# Patient Record
Sex: Female | Born: 1950 | Race: Asian | Hispanic: No | Marital: Single
Health system: Western US, Academic
[De-identification: ages and names within clinical notes are randomized; demographics above are authoritative.]

---

## 2018-02-27 ENCOUNTER — Encounter (HOSPITAL_BASED_OUTPATIENT_CLINIC_OR_DEPARTMENT_OTHER): Payer: Self-pay | Admitting: Orthopaedic Surgery

## 2018-02-27 ENCOUNTER — Other Ambulatory Visit: Payer: Self-pay

## 2018-02-27 ENCOUNTER — Ambulatory Visit: Payer: Medicare Other | Attending: Orthopaedic Surgery | Admitting: Orthopaedic Surgery

## 2018-02-27 VITALS — BP 134/81 | HR 74 | Temp 98.1°F | Ht 62.0 in | Wt 180.0 lb

## 2018-02-27 DIAGNOSIS — M539 Dorsopathy, unspecified: Principal | ICD-10-CM | POA: Insufficient documentation

## 2018-02-27 NOTE — Patient Instructions (Addendum)
It was a pleasure seeing you today! You were seen today by Dr. Cherly Hensenhang. Today, we discussed:     Muscle pain  Spine arthritis    1. Follow up with your primary care provider Dr Selena BattenKim for:    Physical therapy consultation and "TENS" unit prescription    Family Medicine Collaborative Care consultation for biofeedback:  call the behavioral health care provider for biofeedback at [phone], who can provide information about the current availability of these services, which can either be paid out of pocket or billed through your health insurance plan (coverage and fees vary by plan).    [facility]cup[facility] at [facility]    [email or web address]      Talk to you PCP Dr Selena BattenKim about pain medications        Take care!  Sherrie Georgeouglas Jurnie Garritano, MD PhD

## 2018-02-27 NOTE — Progress Notes (Signed)
Attending Note:   Dr. Casimer LaniusKim, Terri Chapman requests Orthopaedic Surgical consultation for the patient Terri Chapman for problems related to the   Chief Complaint   Patient presents with   . Back Pain     My report follows:    Subjective:   I reviewed the history and medical record, received and reviewed the H&P intake form, and interviewed and examined the patient.    History of present illness (HPI): 67 year old female here for Orthopaedic Surgical evaluation to treat her pain and functional impairment.    Back pain started 10 years ago, doctor dx'd her with osteoporosis. More frequent pain 2 years ago, started to be daily. Pain in the low back midline to both buttocks sometimes lateral thigh goes to the knee (bilaterally). She says that the back pain is constant but that her leg symptoms last only a few minutes and occur only 3-4 times a week. Actually she reports although she was sent here for back pain, the main pain almost twice as bad as her low back pain is pain about the base of her neck and top of the shoulders. No radiation of pain to the arms/hands. MED ibuprofen 600 mg BID some relief. PT done last year not helpful. Massage done, they advised some equipment to help her, sounds like a TENS machine (not purchased). She wonders about further massage treatments. Pain severity 4-6/10. Difficult to sleep. Gained weight, no f/c/ns, no voiding problems.    I reviewed the past medical history/problem list, allergies, medications, family history and social history from the electronic medical record, and on the intake paperwork:    There is no problem list on file for this patient.    No Known Allergies  No current outpatient medications on file.     No current facility-administered medications for this visit.      No family history on file.No hereditary or high-risk disease identified otherwise.    Social History     Tobacco Use   . Smoking status: Not on file   Substance Use Topics   . Alcohol use: Not on file   . Drug  use: Not on file     Review of Systems: as indicated in the history of present illness and per the intake form. All others reviewed and were negative.    Objective:   Vitals   BP 134/81 (BP Location: Right arm, BP Patient Position: Sitting, BP cuff size: Large)   Pulse 74   Temp 98.1 F (36.7 C) (Oral)   Ht 5\' 2"  (1.575 m)   Wt 81.6 kg (180 lb)   BMI 32.92 kg/m   Gen WNWD, Pulm No SOB, Abd no discomfort, CV 1+ pulses PT  MS cooperative and pleasant  HEENT pupils not pinprick  Neuro normal babinski, symmetric normal DTRs, no clonus on ankle jerk, full strength hf/ke/df/ehl  normal hoffmans, normal DTRs, full strength (grip/WE/EE/EF/ShAbd), normal finger coordinated movements'  NECK moderate forward head posture with rounded shoulders  L SPINE ROME uncomfortable    Studies        Assessment and plan:     Myofascial pain, underlying facet spinal arthritis    This patient does not have a condition amenable to Orthopaedic surgery at this time. Overall lumbar fusion success rate for low back pain ranges from 41-64%, and there is associated morbidity. (Zigler, Delamarter, Vevelyn PatSpivak et al. Spine 2007).     In the meanwhile, here are management suggestions for your office to coordinate:    1.  Follow up with your primary care provider Dr Terri Chapman for:    Physical therapy consultation and "TENS" unit prescription    Family Medicine Collaborative Care consultation for biofeedback:  call the behavioral health care provider for biofeedback at [phone], who can provide information about the current availability of these services, which can either be paid out of pocket or billed through your health insurance plan (coverage and fees vary by plan).    Massage Therapy/Acupuncture at [facility]    [email or web address]      Talk to you PCP Dr Terri Chapman about pain medications    More than half of the 30 minute clinic appointment time was spent face-to-face with the patient to provide  education, counseling and coordination of care. We discussed issues as described above. All of the patient's questions were answered to their satisfaction.    Terri Chapman, M.D., Ph.D.  Clinical Professor, [employer] Department of Orthopaedic Surgery  Chief, Physical Medicine and Rehabilitation   [employer] [employer] medical consultant  [employer] [facility] medical consultant    [email or web address]    CC: Terri Chapman

## 2019-06-18 IMAGING — MG MAMMO SCRN BIL W/CAD TOMO
8 series · 9 of 24 positions shown · non-contrast
Comparison: The present examination has been compared to prior imaging studies.

Images Obtained from [facility]
INDICATION: Screening.
TECHNIQUE: Bilateral 2-D digital screening mammogram was performed followed by 3-D tomosynthesis.  Current study was also evaluated with a computer aided detection (CAD) system.
MAMMOGRAM FINDINGS:
There are scattered areas of fibroglandular density.
No suspicious abnormality is seen in either breast.  There are no significant changes from the prior study.

[L CC]
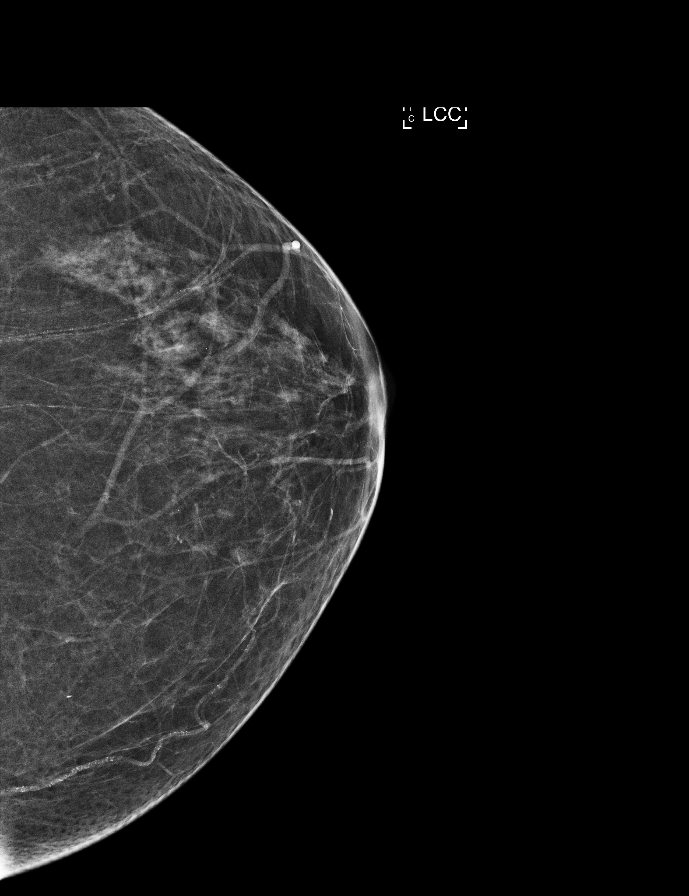

[L MLO]
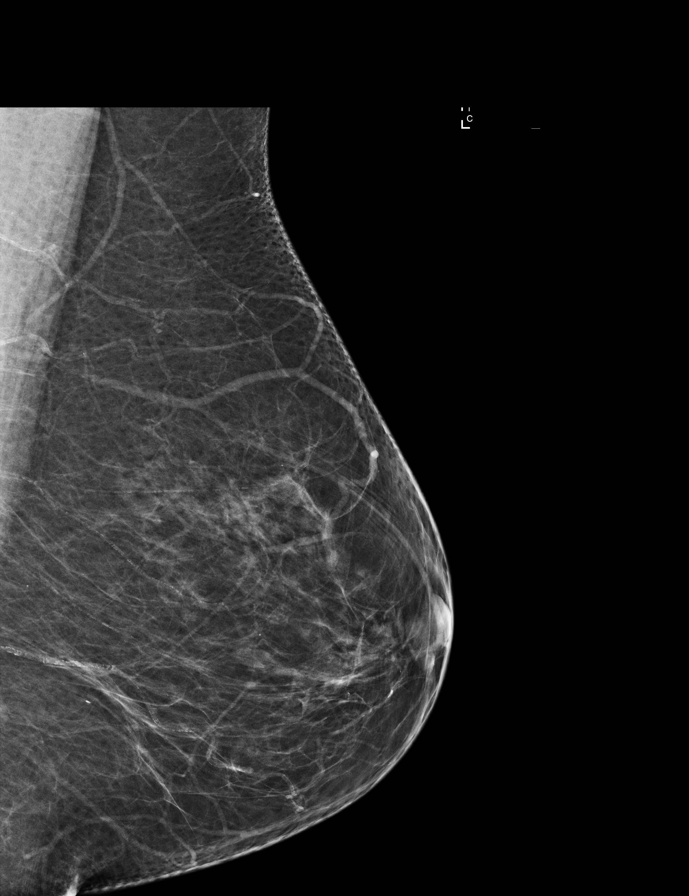

[R MLO]
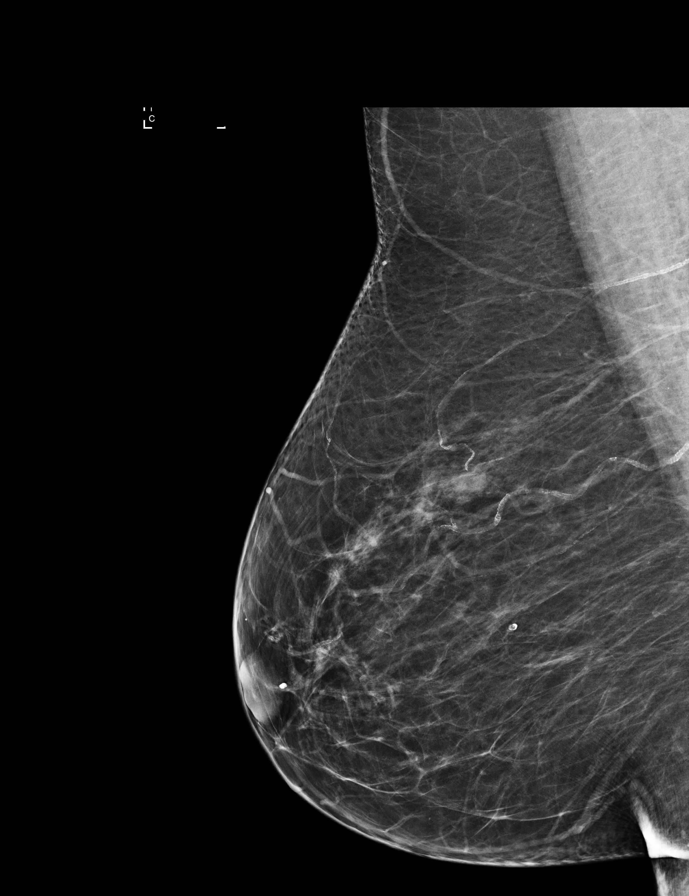

[R CC]
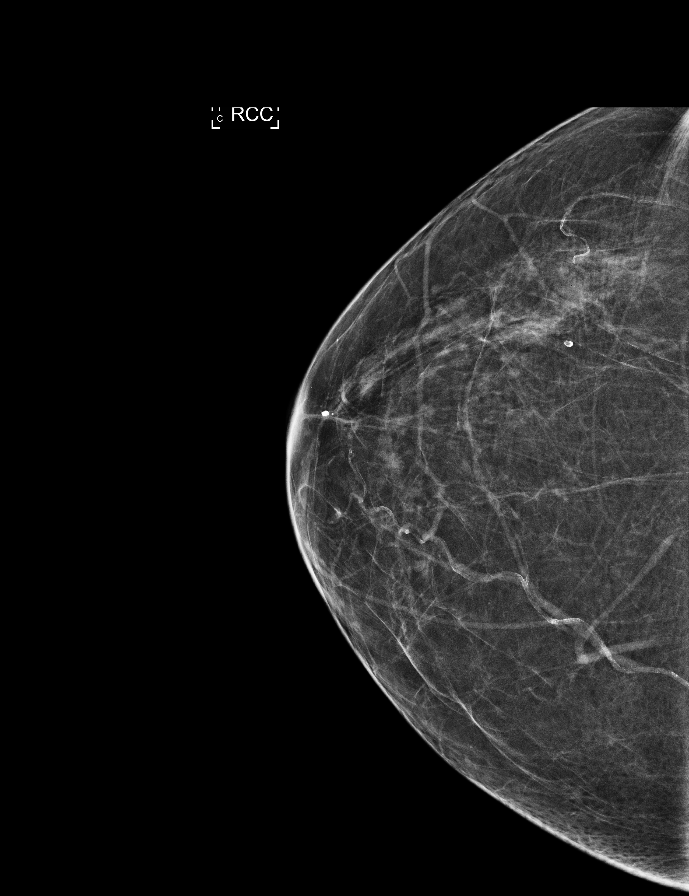

[L CC tomo · 2 of 58 frames shown]
[frame 19/58]
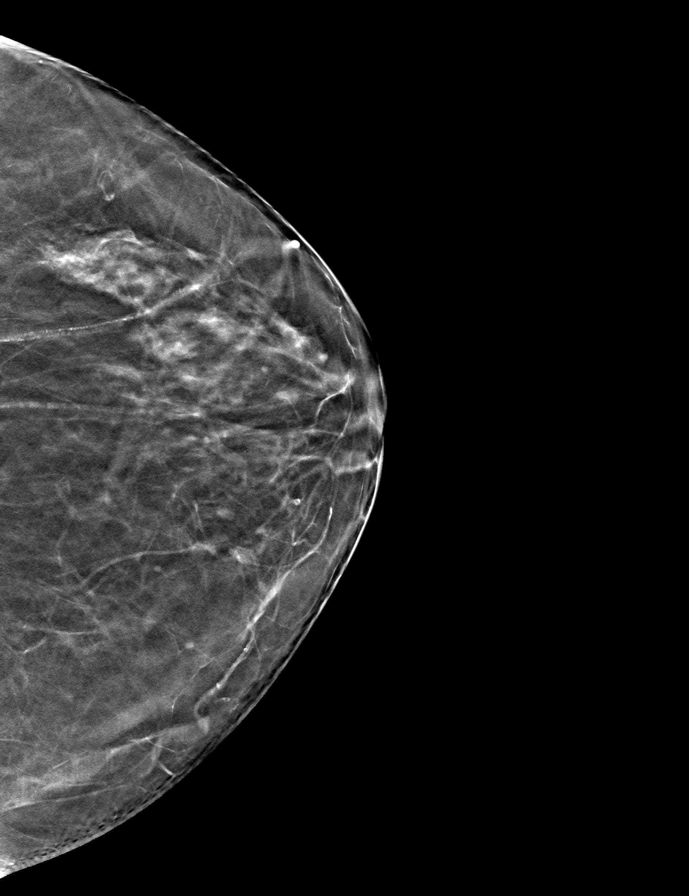
[frame 29/58]
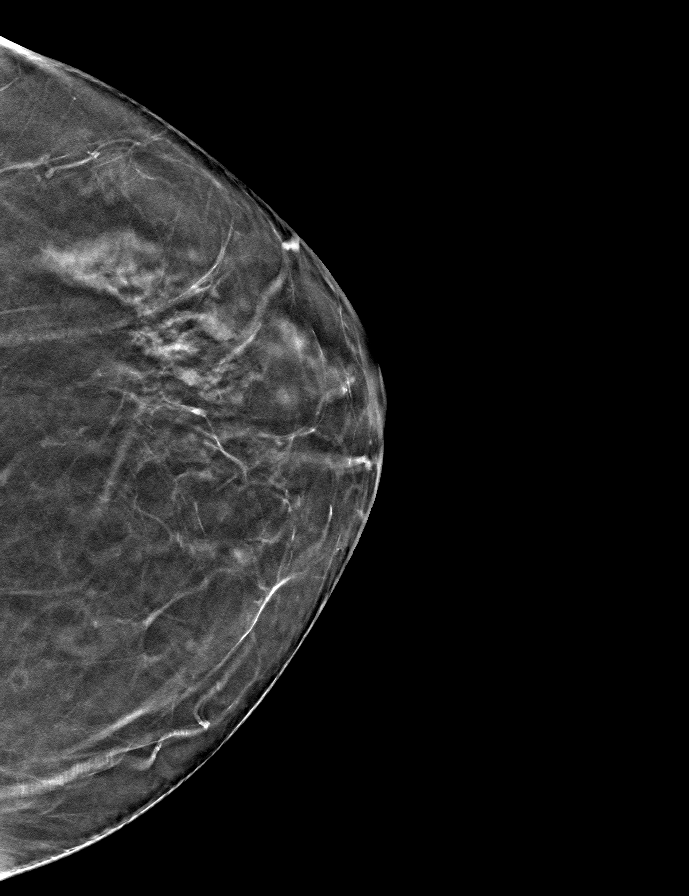

[L MLO tomo · tomo slice 33/66.0]
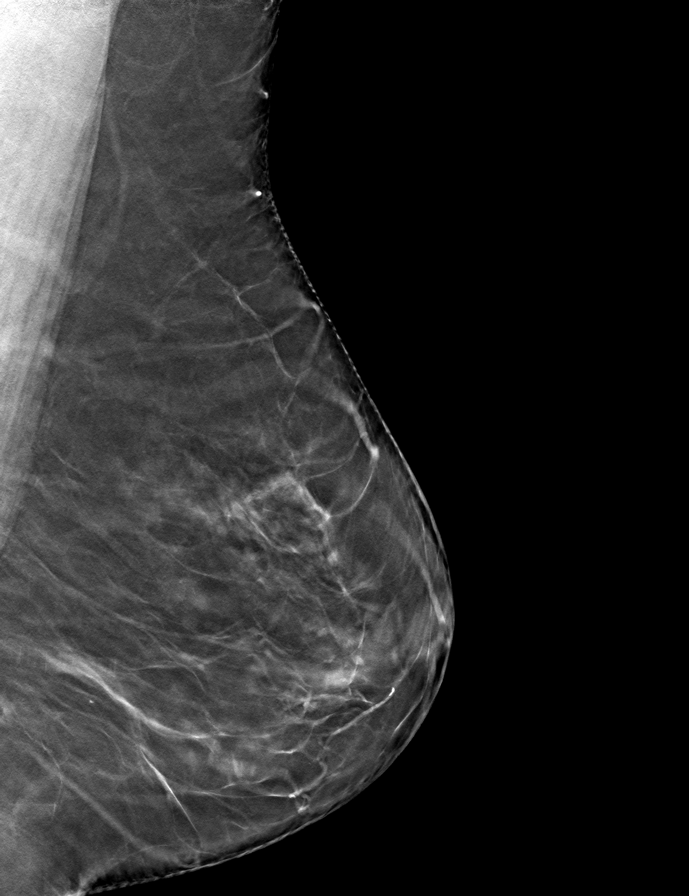

[R MLO tomo · tomo slice 35/69.0]
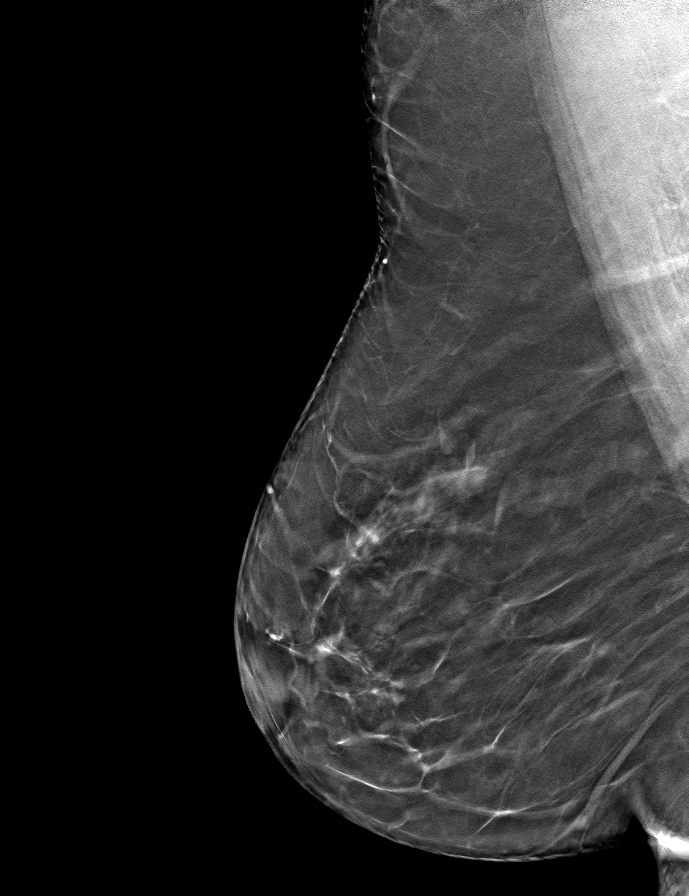

[R CC tomo · tomo slice 30/59.0]
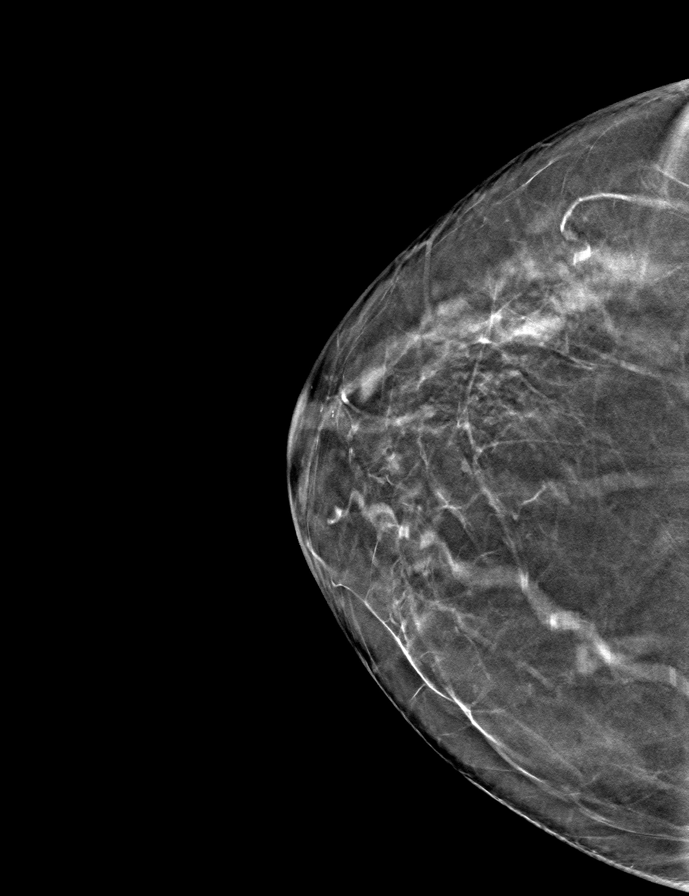

[9 of 24 positions shown; findings below may reference images not displayed]

IMPRESSION: There is no mammographic evidence of malignancy.
Screening mammogram recommended in 1 year.
BI-RADS Category 1: Negative

## 2019-07-19 ENCOUNTER — Encounter (INDEPENDENT_AMBULATORY_CARE_PROVIDER_SITE_OTHER): Payer: Self-pay

## 2020-08-03 IMAGING — CR KNEE RT 3 VWS
1 series · 3 of 3 positions shown · non-contrast
Comparison: None

Right knee radiographs, 3 views
INDICATION: Right knee pain

[Series 1: ap · 0.17mm/px · 3 of 3 slices shown]
[im 1/3]
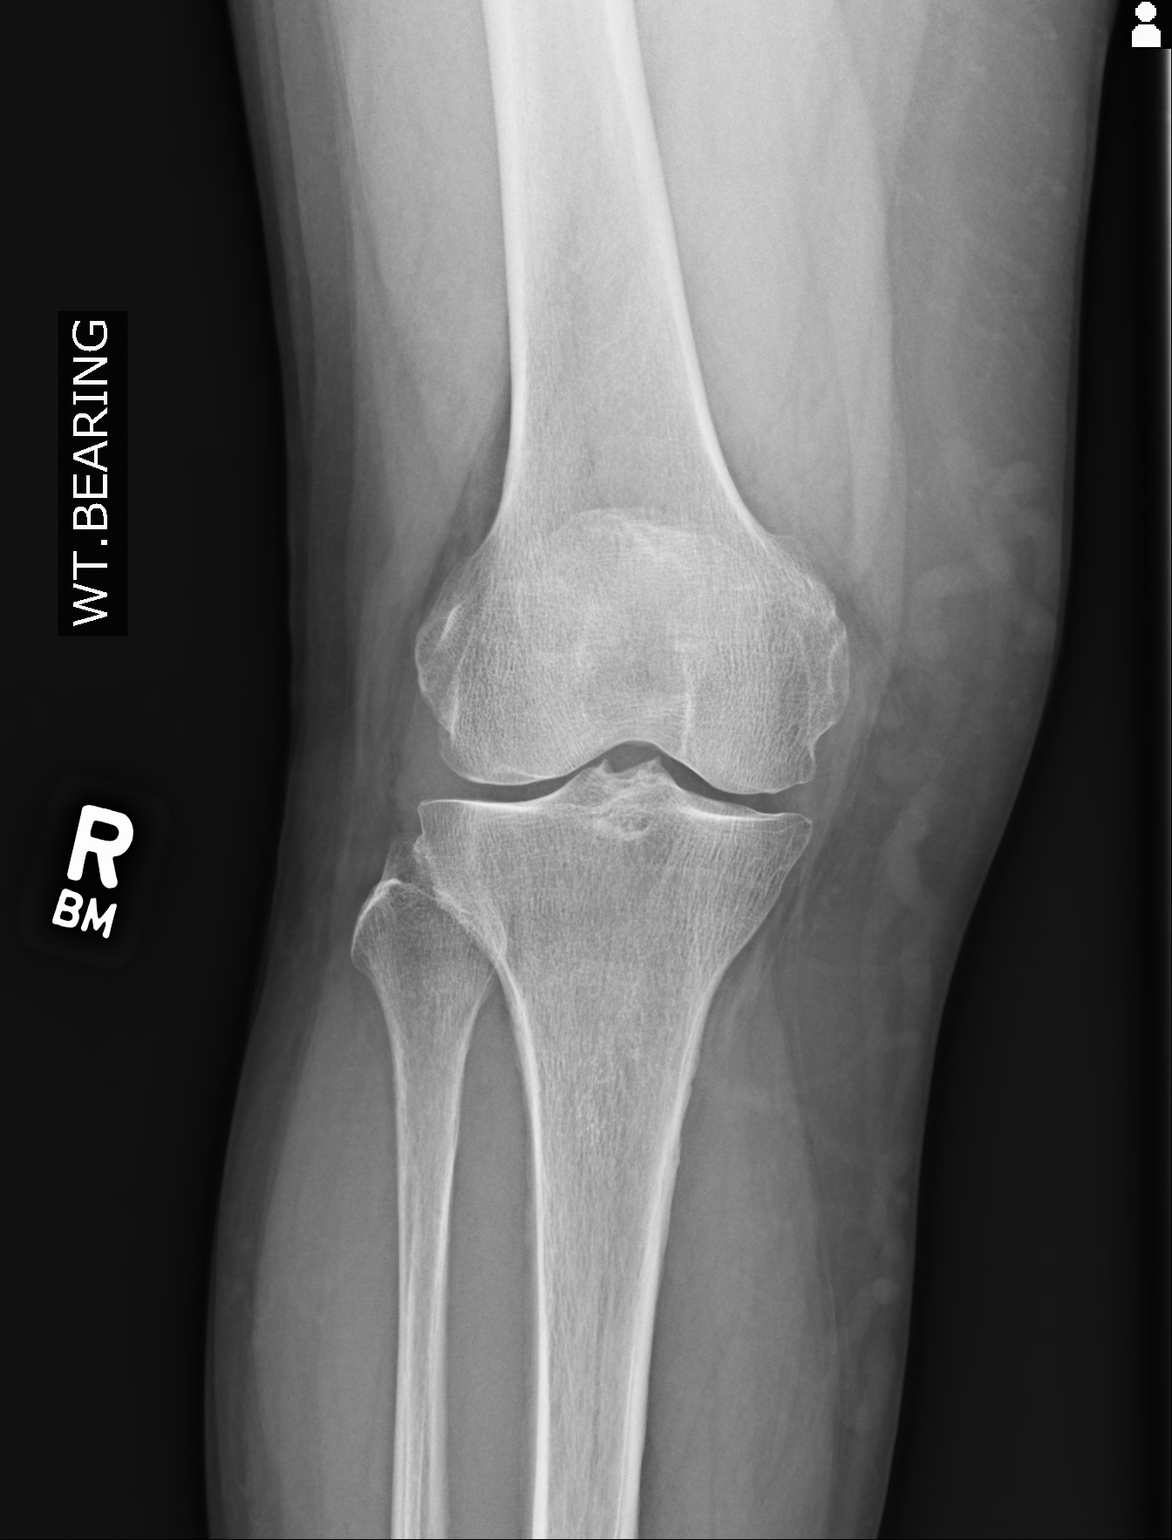
[im 2/3]
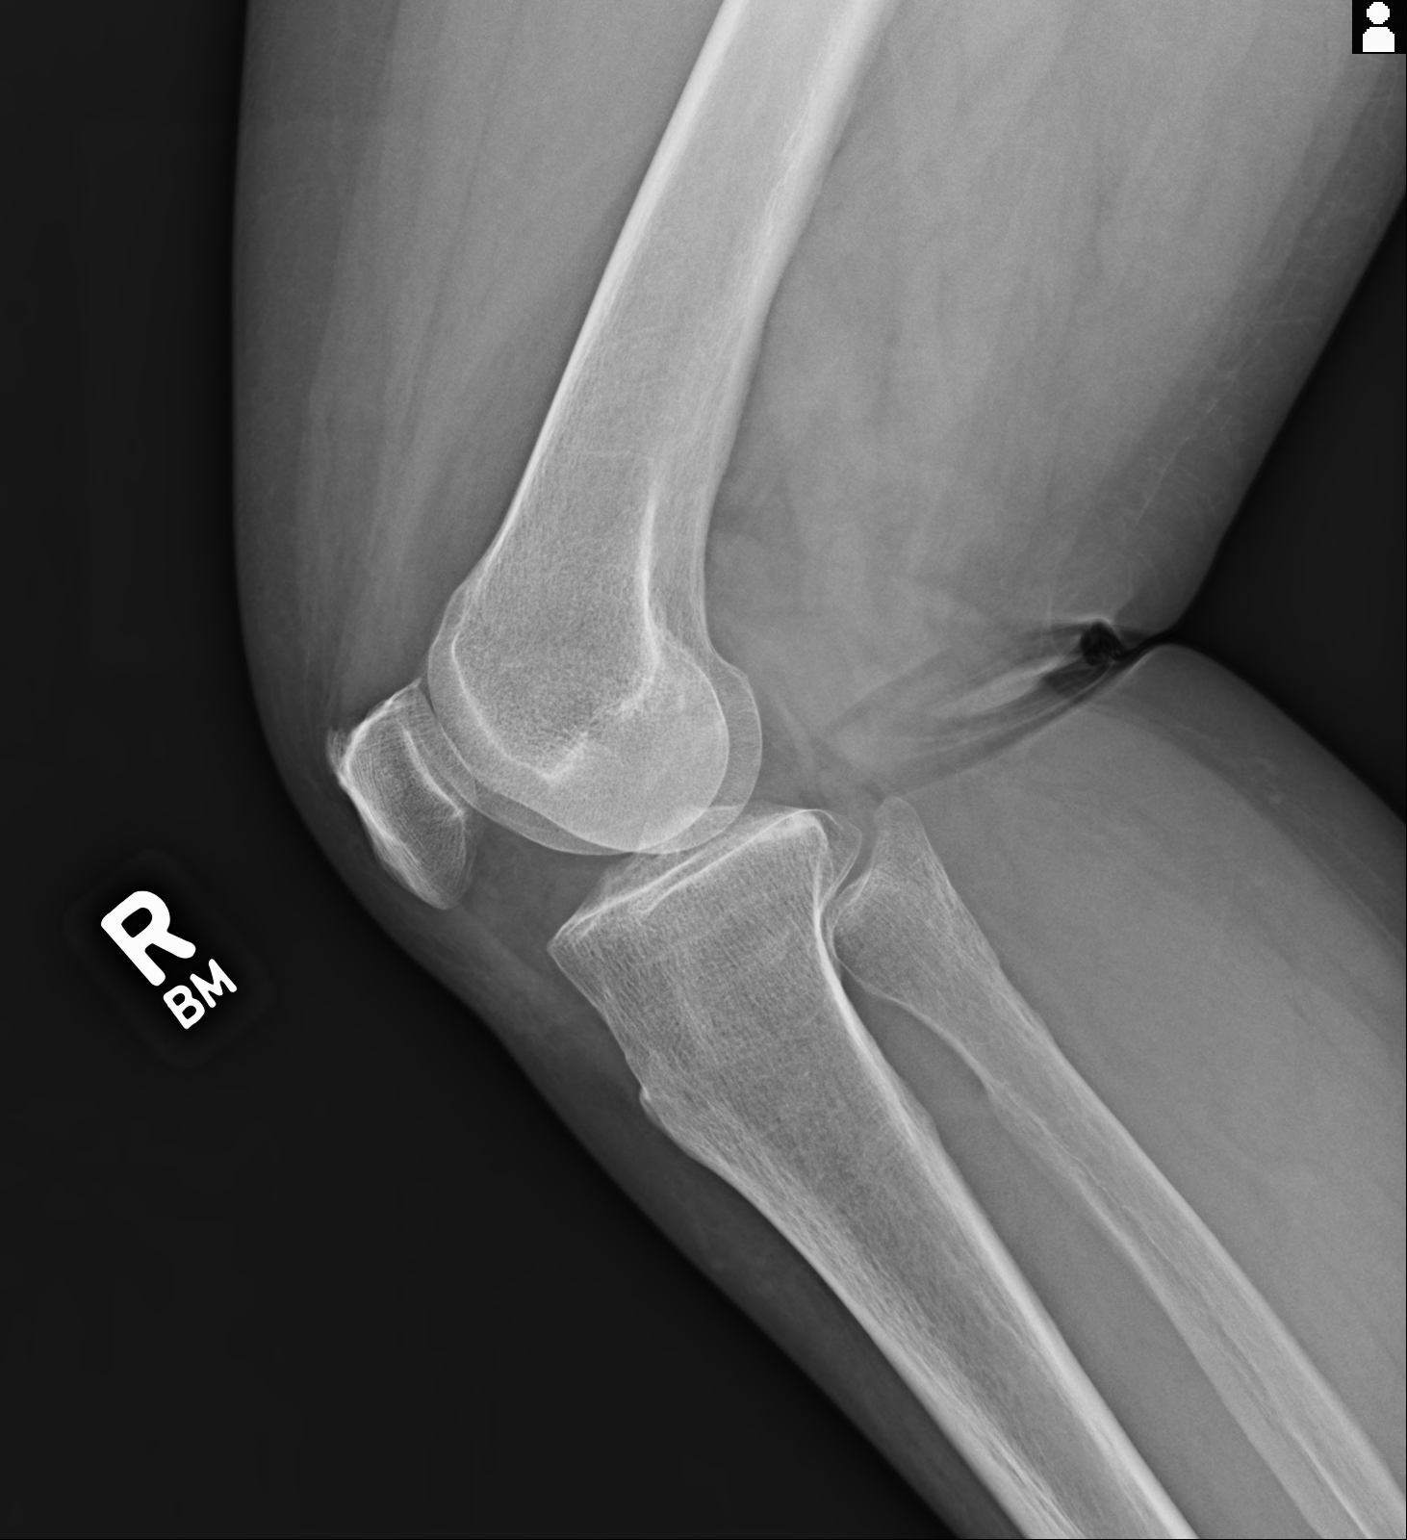
[im 3/3]
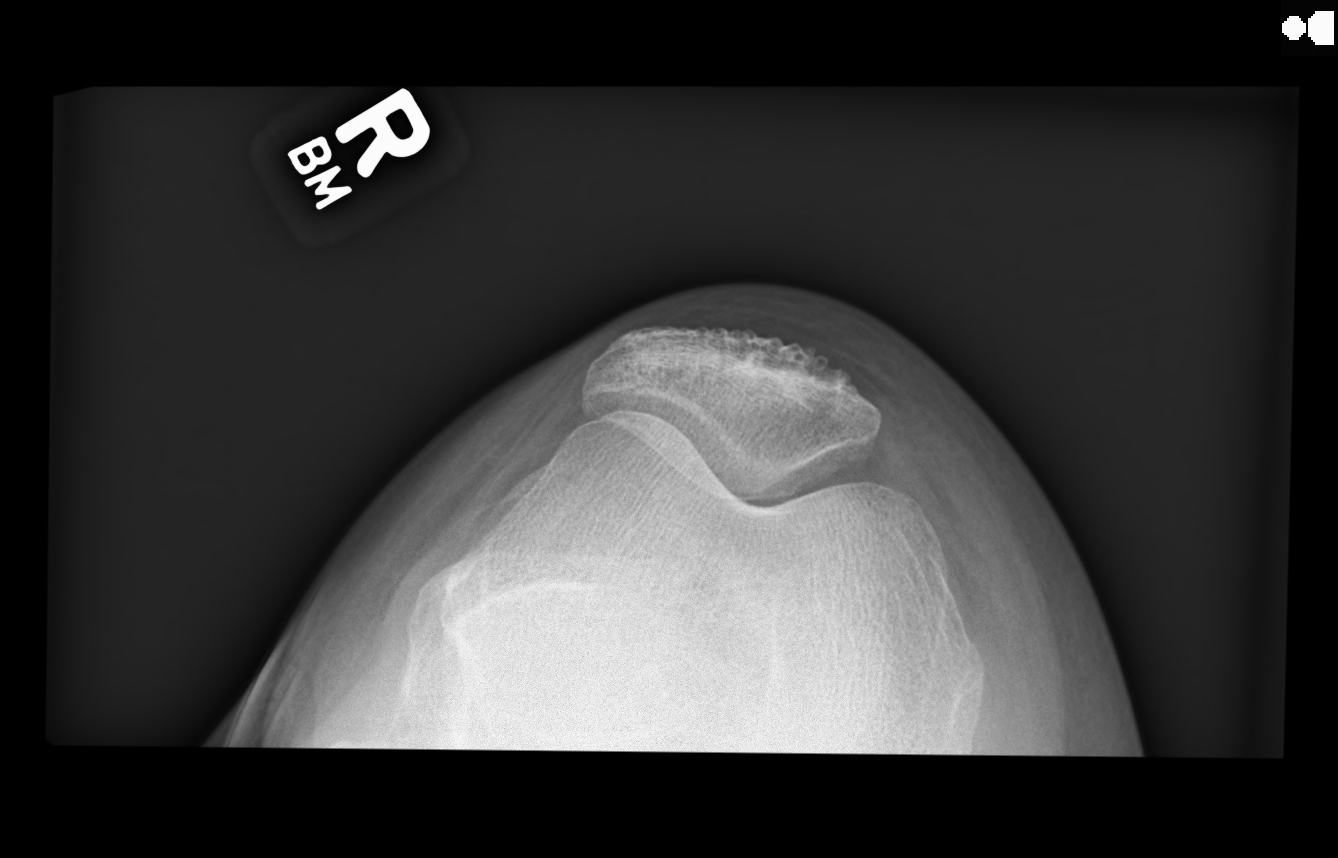

[3 of 3 positions shown; findings below may reference images not displayed]

FINDINGS: No acute fracture. No dislocation. No joint effusion. Joint spaces are intact.
IMPRESSION: Unremarkable right knee radiographs.

## 2020-09-07 IMAGING — MR MRI KNEE RT WO CONTRAST
4 of 6 series · 24 of 40 positions shown · non-contrast
Comparison: Right knee radiographs 08/03/2020

INDICATION: Pain in right knee
TECHNIQUE: Multiplanar, multiecho imaging of the right knee was performed, including T1-weighted and fluid sensitive sequences without intravenous contrast administration.

[Series 5: t2_axial_fs · axial · right · 3.0mm · 0.42mm/px · z∈[-24,+75]mm · 5 of 26 slices shown]
[im 1/26]
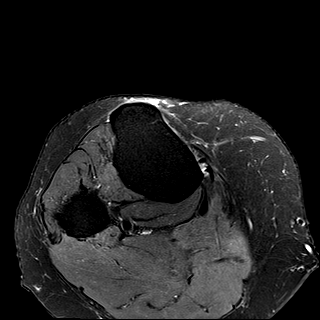
[im 7/26]
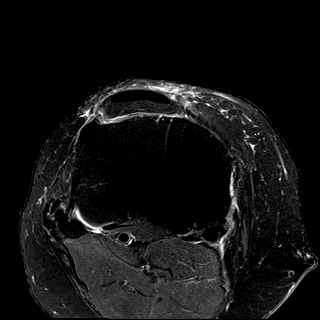
[im 13/26]
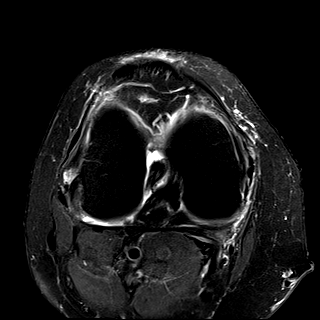
[im 19/26]
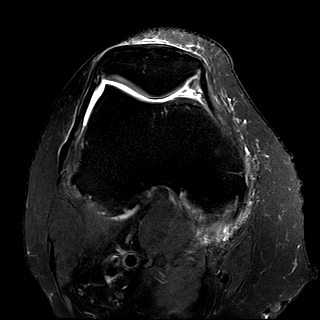
[im 26/26]
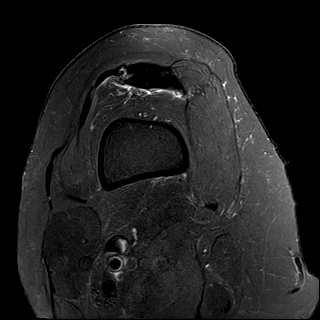

[Series 7: t1_cor · coronal · right · 3.5mm · 0.35mm/px · 6 of 23 slices shown]
[im 1/23]
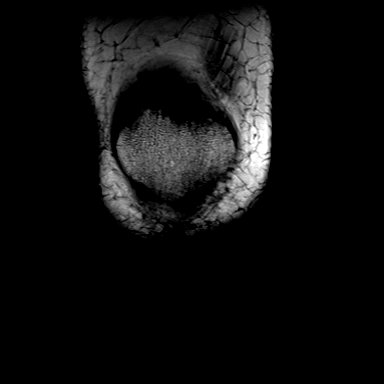
[im 5/23]
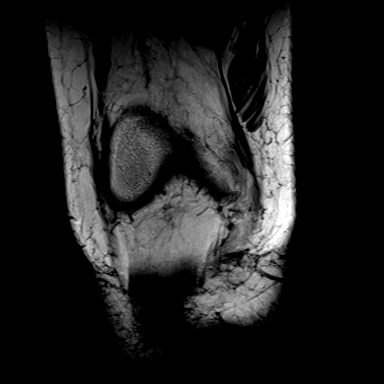
[im 9/23]
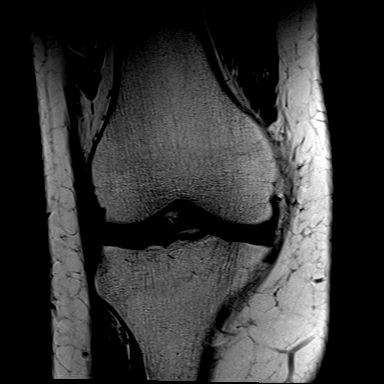
[im 14/23]
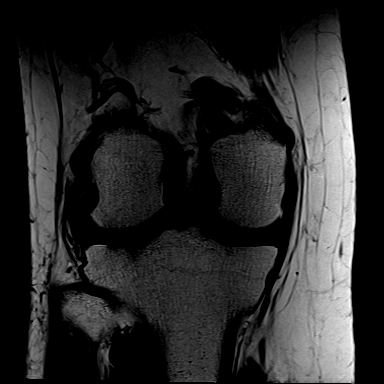
[im 18/23]
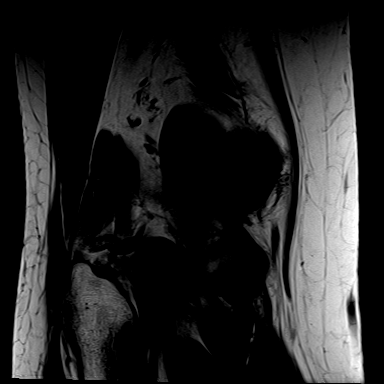
[im 23/23]
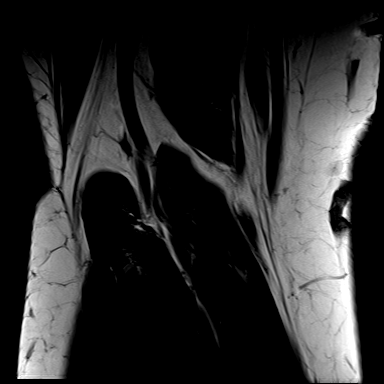

[Series 8: t2_cor_fs · coronal · right · 3.5mm · 0.42mm/px · 6 of 23 slices shown]
[im 1/23]
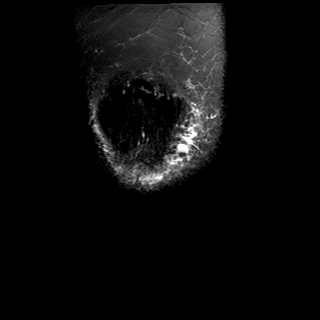
[im 5/23]
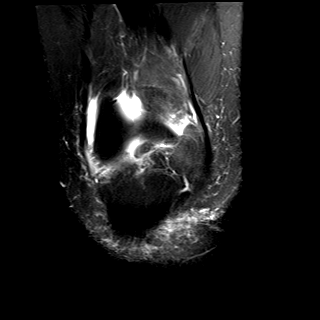
[im 9/23]
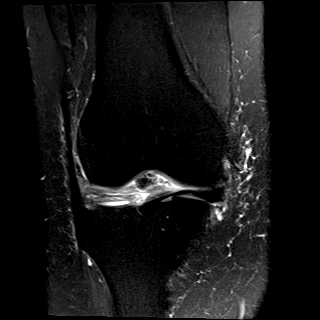
[im 14/23]
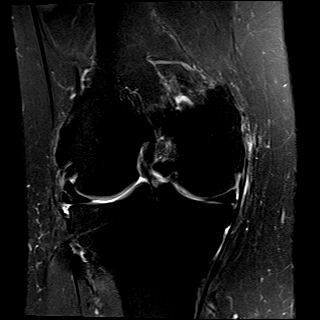
[im 18/23]
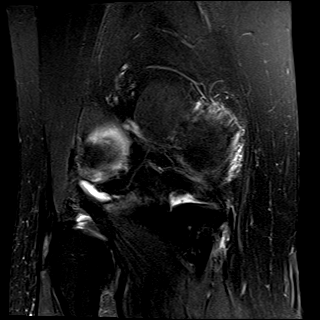
[im 23/23]
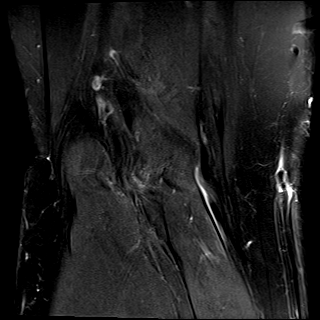

[Series 101: pd_sag_fs · sagittal · right · 3.0mm · 0.20mm/px · 7 of 27 slices shown]
[im 1/27]
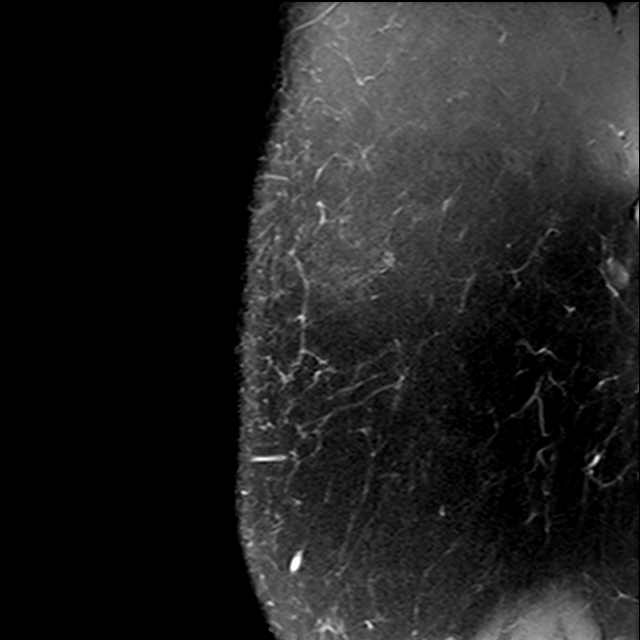
[im 5/27]
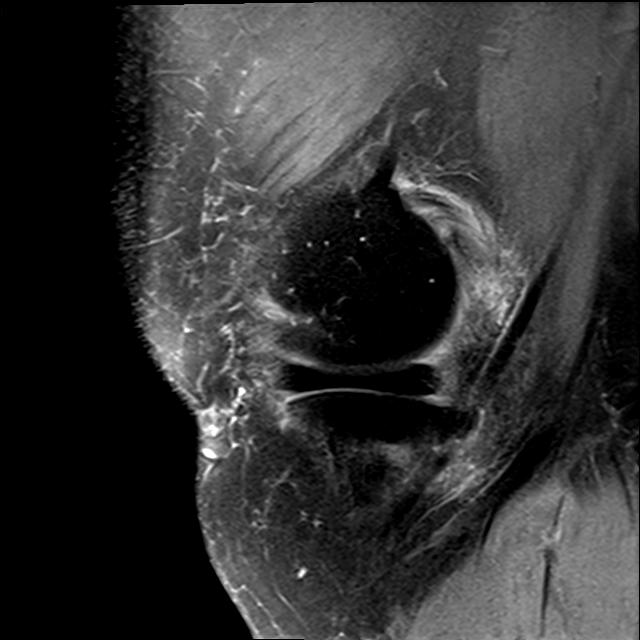
[im 9/27]
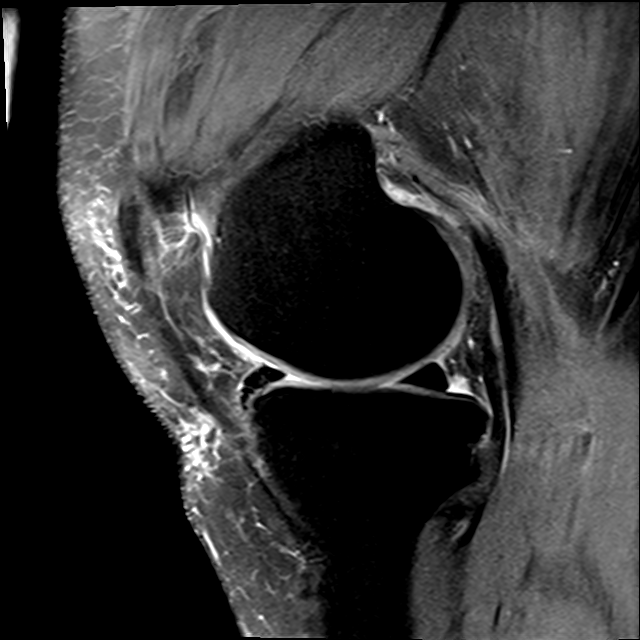
[im 14/27]
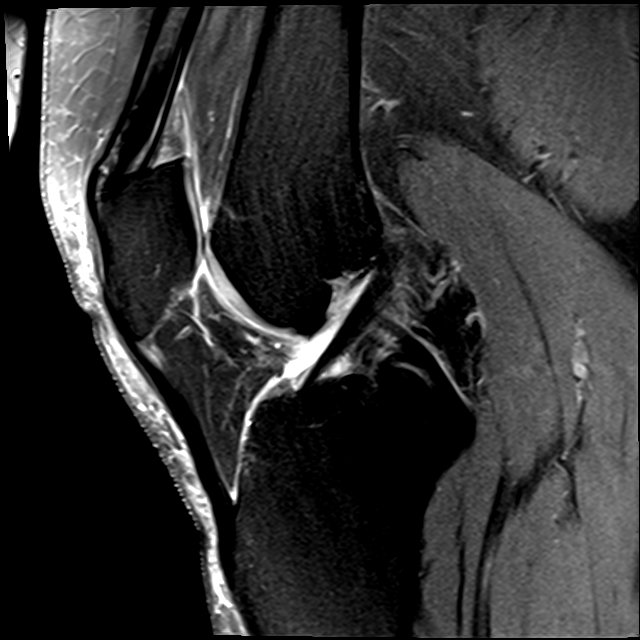
[im 18/27]
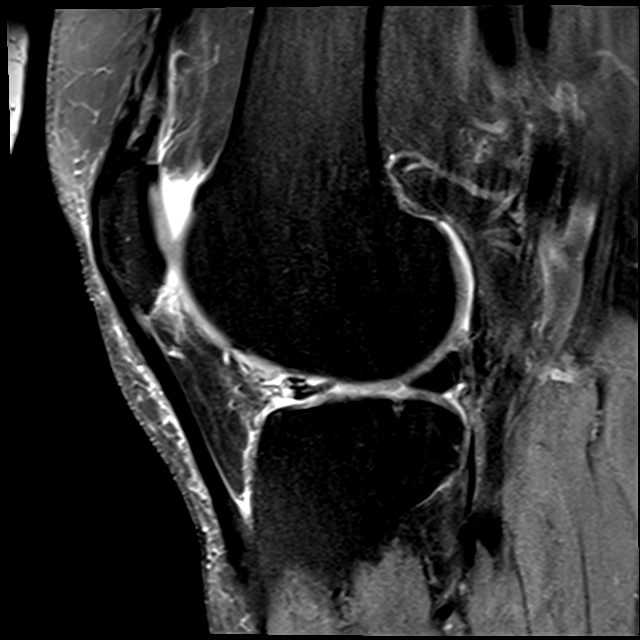
[im 22/27]
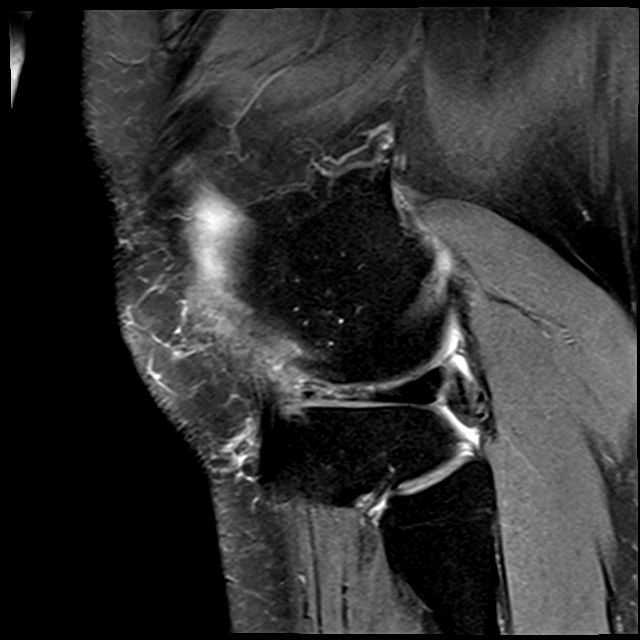
[im 27/27]
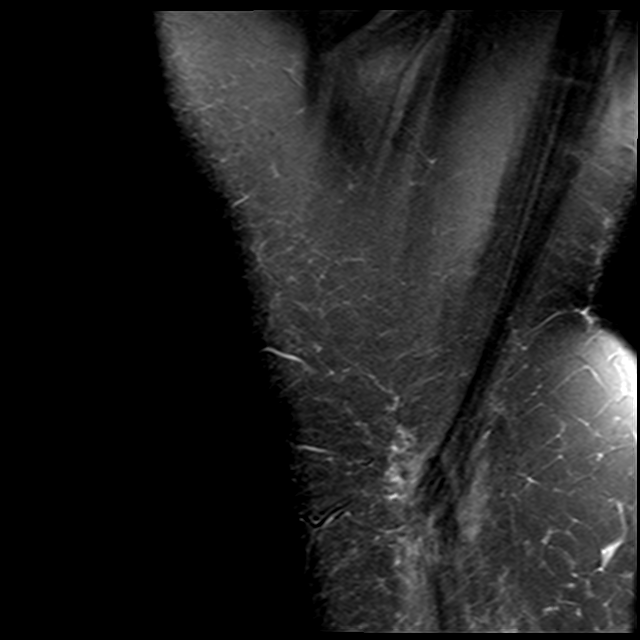

[24 of 40 positions shown; findings below may reference images not displayed]

FINDINGS: MEDIAL MENISCUS:  Intact.

LATERAL MENISCUS:  Degenerative maceration of the anterior horn and body, with partial extrusion of fragments into the gutter.

ACL:  Intact.

PCL:  Intact.

MCL:  Intact.

LATERAL LIGAMENTS AND TENDONS:  LCL is intact. Mild tendinosis of the popliteus. IT band is intact.

EXTENSOR MECHANISM:  Trace tendinosis of the proximal patellar tendon.

FAT PADS:   Normal.

CARTILAGE:  

Patellofemoral compartment:  Intact.

Medial compartment:  Intact.

Lateral compartment: Scattered chondral surface irregularity and thinning.

BONE MARROW: Normal.

Trace joint effusion. Mild subcutaneous edema about the anterior and medial aspect of the knee. Tiny Baker's cyst. Mild pes anserine bursitis.
IMPRESSION: 1. Degenerative maceration of the anterior horn and body lateral meniscus, with extrusion of fragments into the gutter.

2. Mild-moderate lateral compartment chondral abnormalities.

## 2020-12-08 IMAGING — OT DXA BONE DENSITY
2 series · 2 of 2 positions shown · non-contrast
Comparison: none

REASON FOR EXAM: Screening for osteoporosis

RISK FACTORS:  None reported.
PRIOR EXAMS:  None
METHOD:  Scans of the spine between L1-L4 and hip were performed using dual energy X-ray densitometry (DXA)

[Series 1: — · 1 of 1 slices shown (1 of 2)]
[im 1/1]
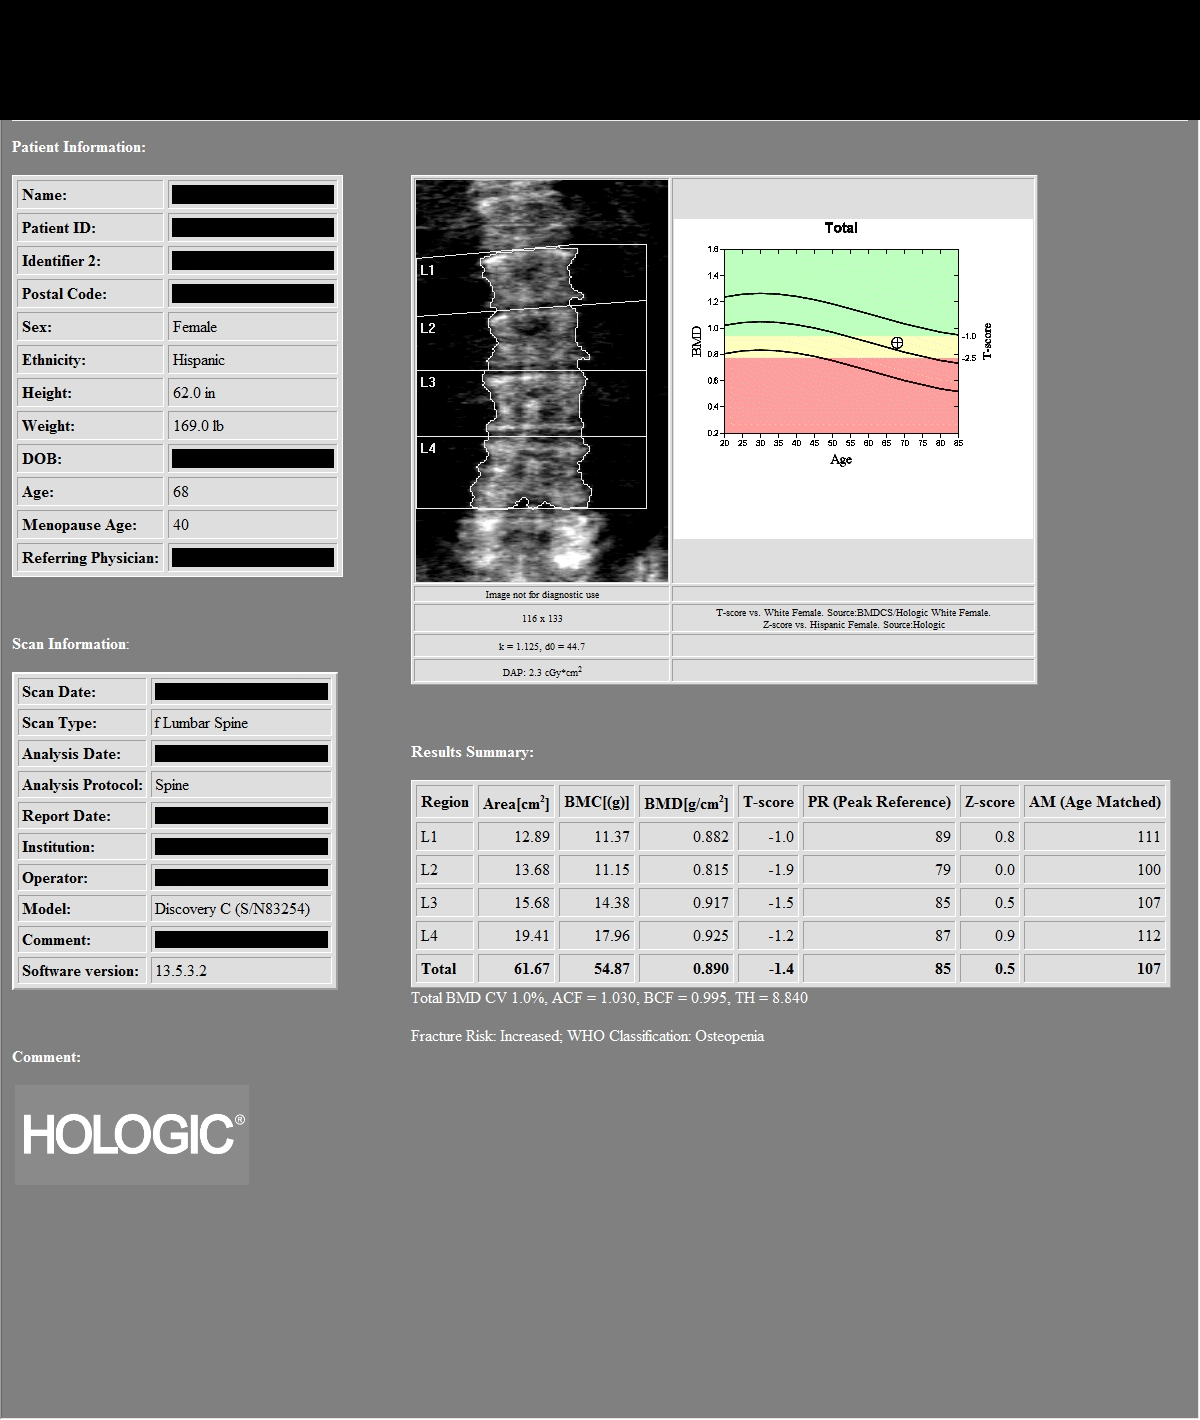

[Series 2: — · left · 1 of 1 slices shown (2 of 2)]
[im 1/1]
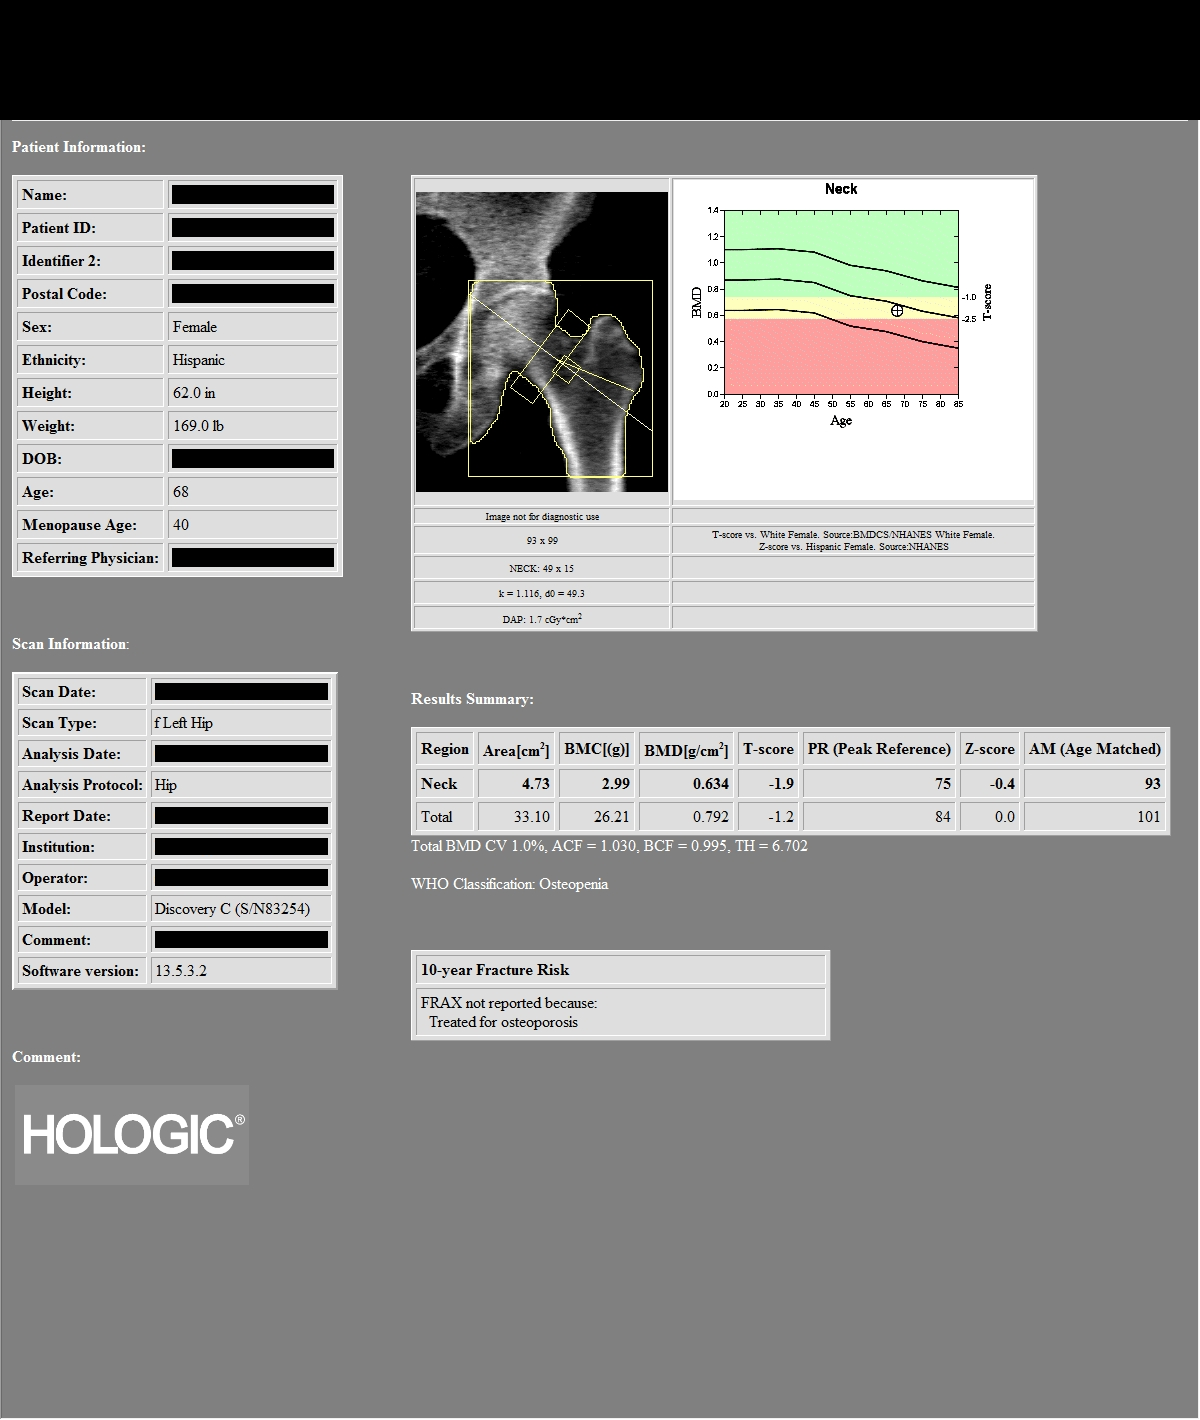

[2 of 2 positions shown; findings below may reference images not displayed]

IMPRESSION: As defined by World Health Organization, the patient meets the criteria for OSTEOPENIA based on lumbar spine and left hip T-scores.

PATIENT DEMOGRAPHICS:  70-year-old Hispanic female.
FINDINGS: 1.    Review of scanogram images shows no factor invalidating scan results.  

2.    The lumbar spine exam using L1-L4 regions shows average Bone Mineral Density is 0.890 gm/cm2 of Hydroxyapatite.  The T-score (comparing patient with a young adult group) is 1.4 standard deviations BELOW mean. The Z-score (comparing patient with an age-matched group) is 0.5 standard deviations ABOVE mean.

3.  The left hip exam using femoral neck region of interest shows average Bone Mineral Density is 0.634 gm/cm2 of Hydroxyapatite. The T-score (comparing patient with a young adult group) is 1.9 standard deviations BELOW mean. The Z-score (comparing patient with an age-matched group) is 0.4 standard deviations BELOW mean.

RECOMMENDATIONS:  The patient states that she is taking supplements on a regular basis.  The patient should continue being a nonsmoker and regular exercise to patient tolerance would be of benefit. The patient reports taking Fosamax for the prevention bone loss. The National Osteoporosis Foundation now recommends followup DXA scanning every two years in patients at risk regardless of whether the patient is undergoing pharmacological treatment.

World Health Organization criteria for diagnosis, please see link below.

[URL]

## 2020-12-08 IMAGING — MG MAMMO SCRN BIL W/CAD TOMO
8 series · 8 of 24 positions shown · non-contrast
Comparison: The present examination has been compared to prior imaging studies.

Images Obtained from [facility]
INDICATION: Screening.
TECHNIQUE: Bilateral 2-D digital screening mammogram was performed followed by 3-D tomosynthesis.  Current study was also evaluated with a computer aided detection (CAD) system.
MAMMOGRAM FINDINGS:
There are scattered areas of fibroglandular density.
No suspicious abnormality is seen in either breast.  There are no significant changes from the prior study.

[L MLO]
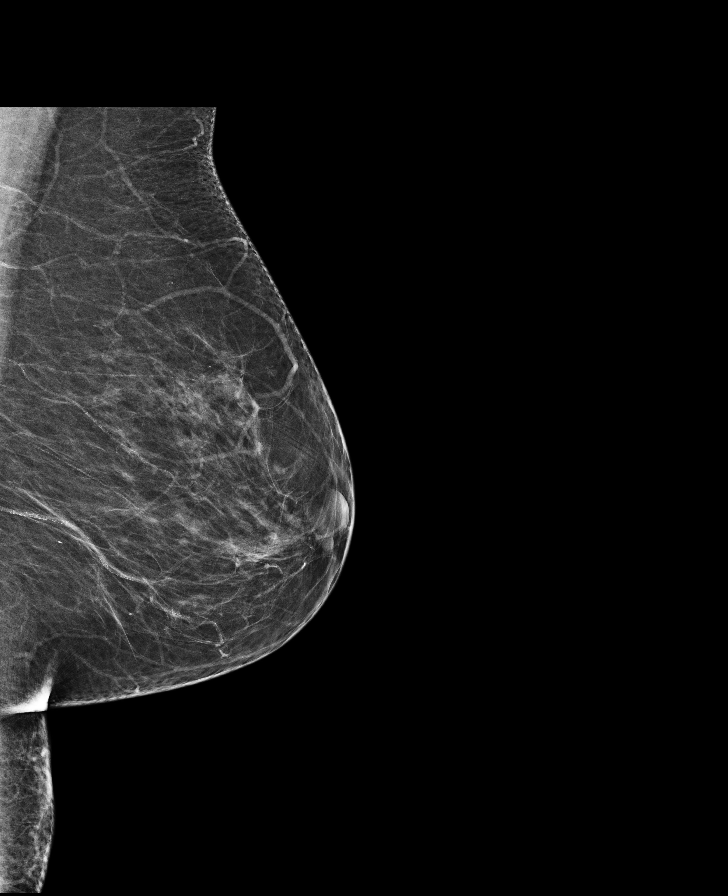

[L CC]
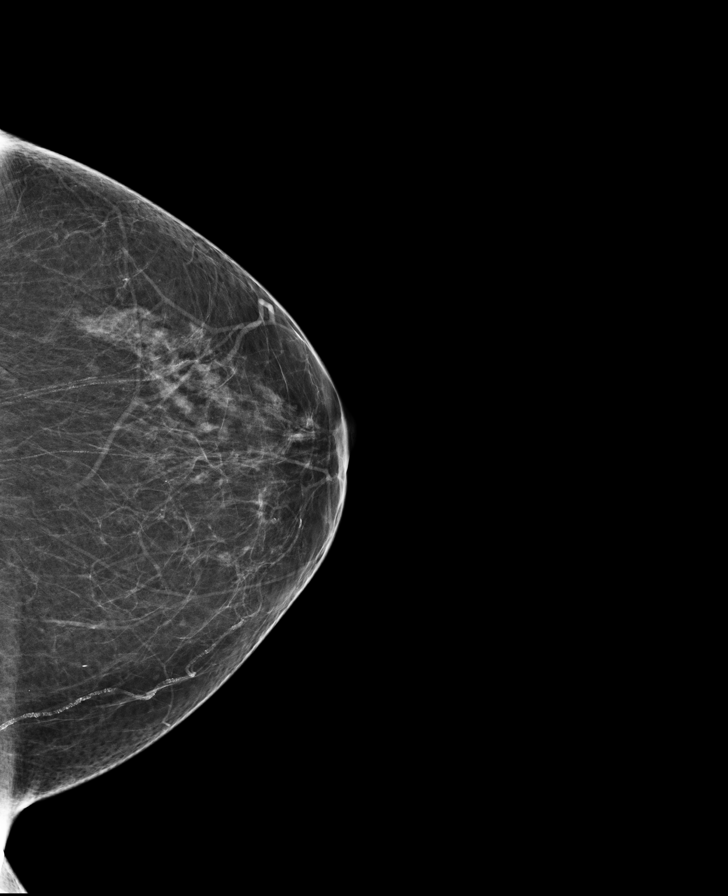

[R MLO]
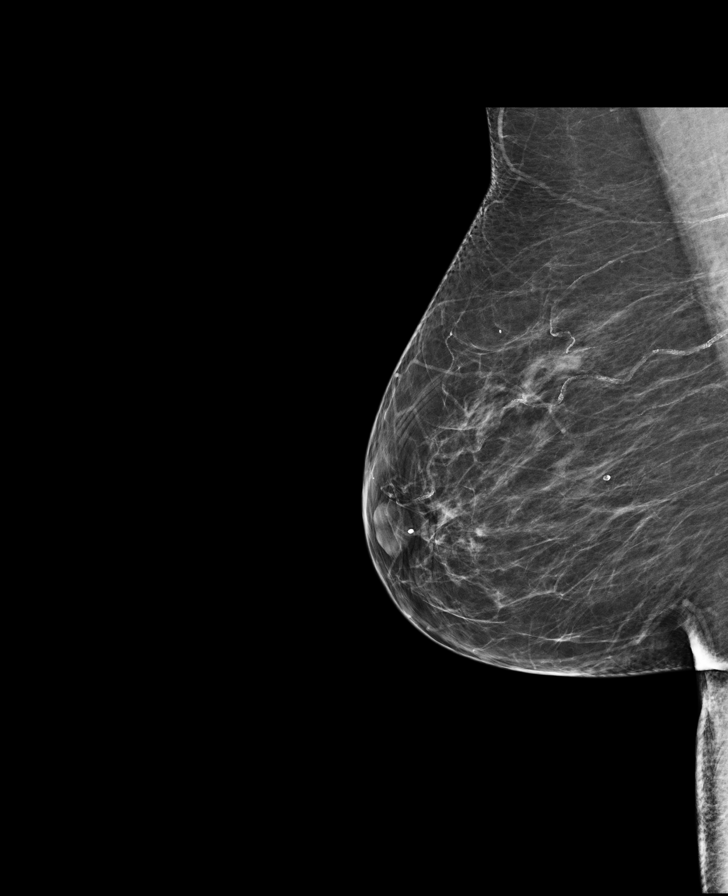

[R CC]
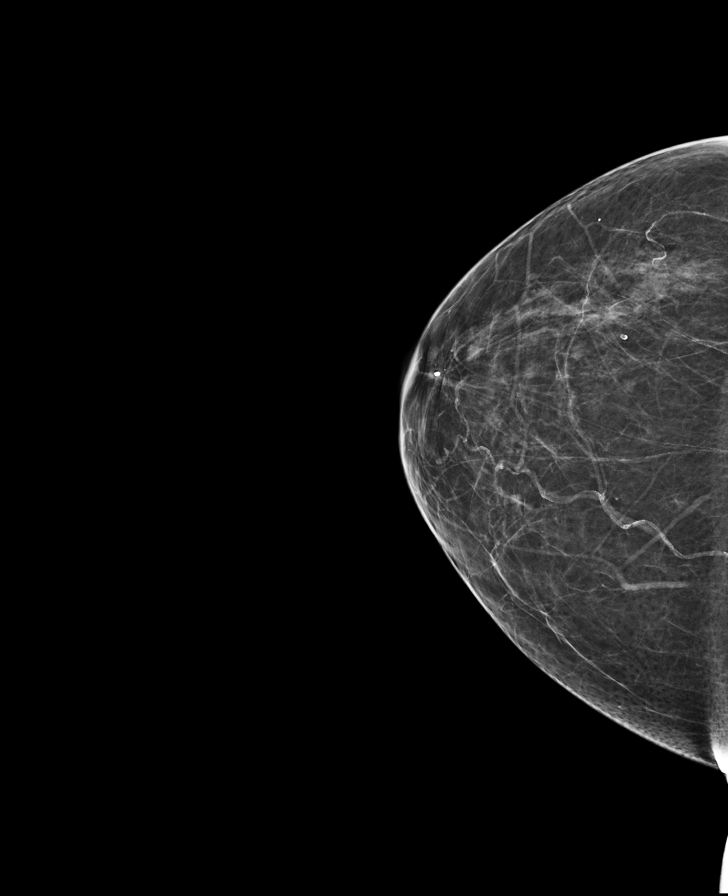

[R MLO tomo · tomo slice 33/66.0]
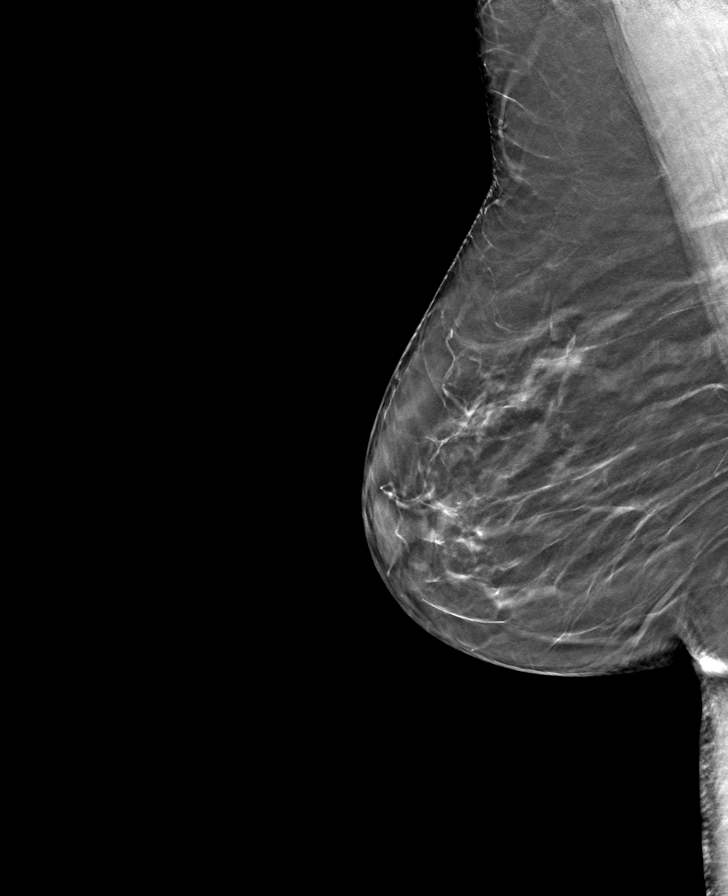

[R CC tomo · tomo slice 29/56.0]
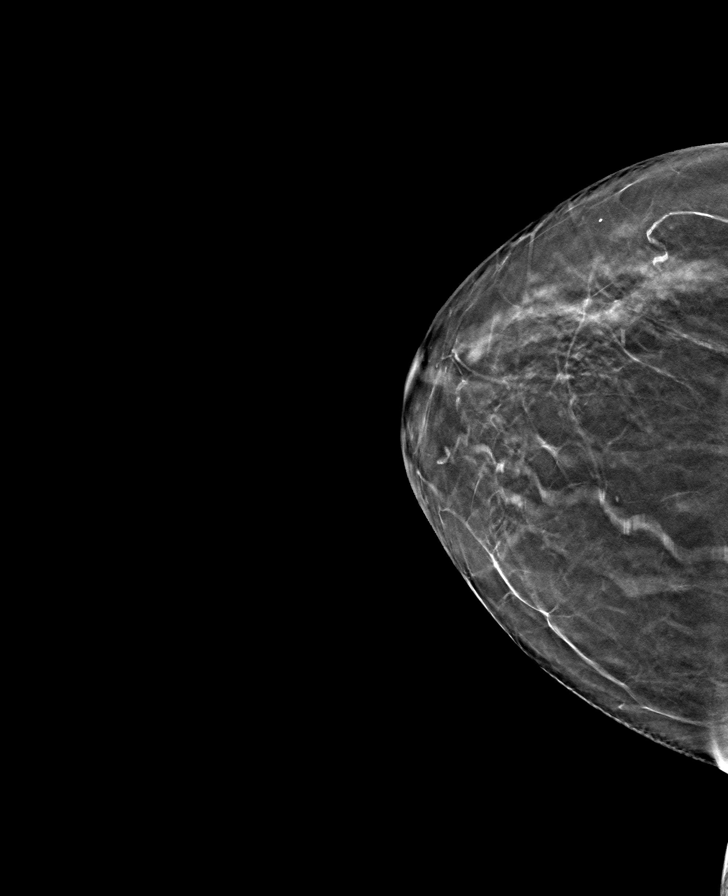

[L CC tomo · tomo slice 31/61.0]
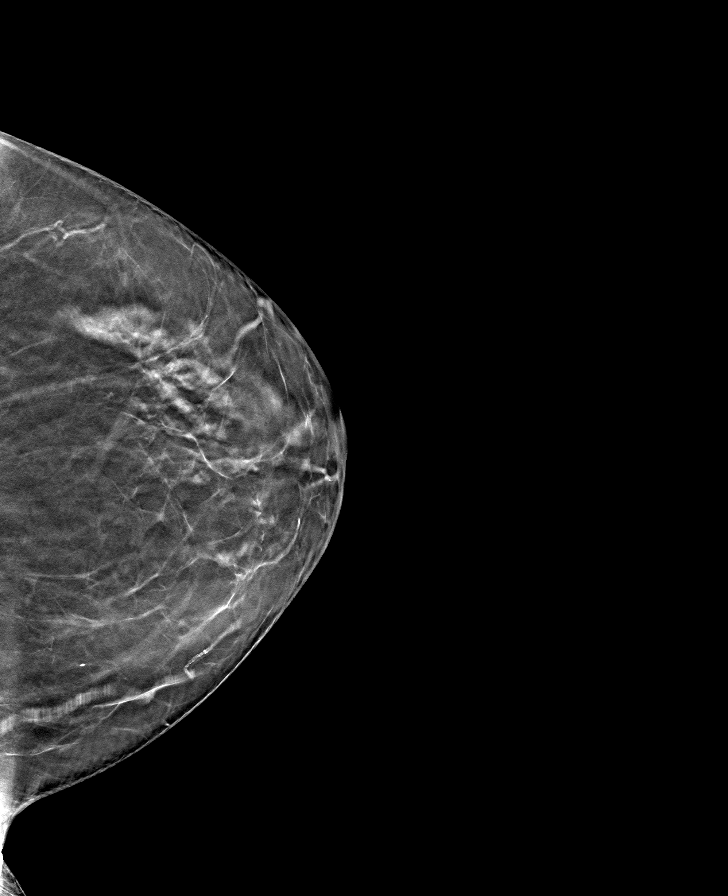

[L MLO tomo · tomo slice 31/62.0]
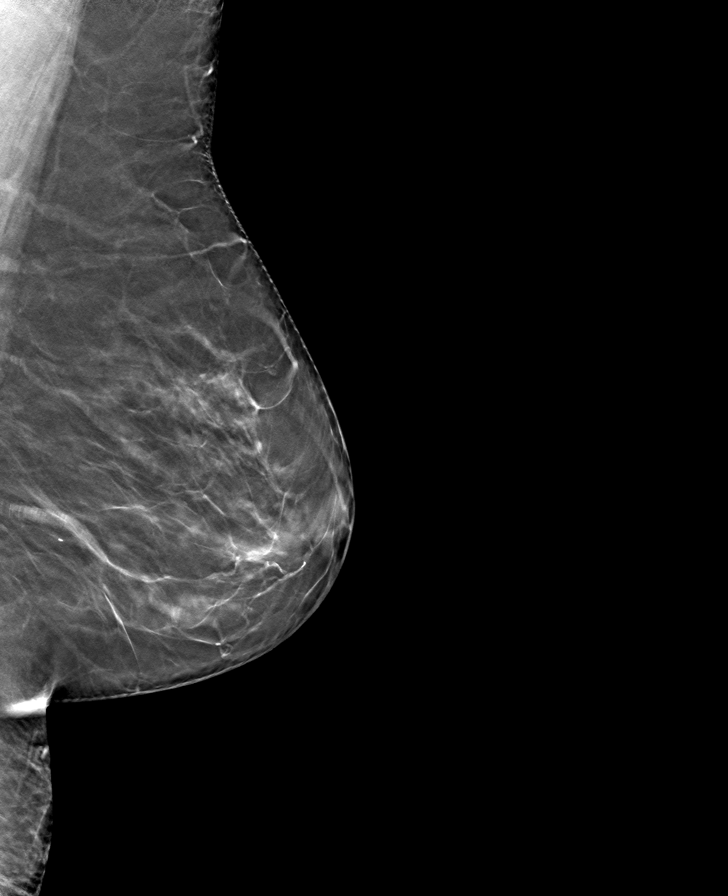

[8 of 24 positions shown; findings below may reference images not displayed]

IMPRESSION: There is no mammographic evidence of malignancy.
Screening mammogram recommended in 1 year.
BI-RADS Category 1: Negative

## 2021-04-09 IMAGING — CR CHEST 2 VWS PA LAT
1 series · 2 of 2 positions shown · non-contrast
Comparison: None

HISTORY: Bronchitis
TECHNIQUE: PA and lateral chest radiographs

[Series 1: lat · 0.17mm/px · 2 of 2 slices shown]
[im 1/2]
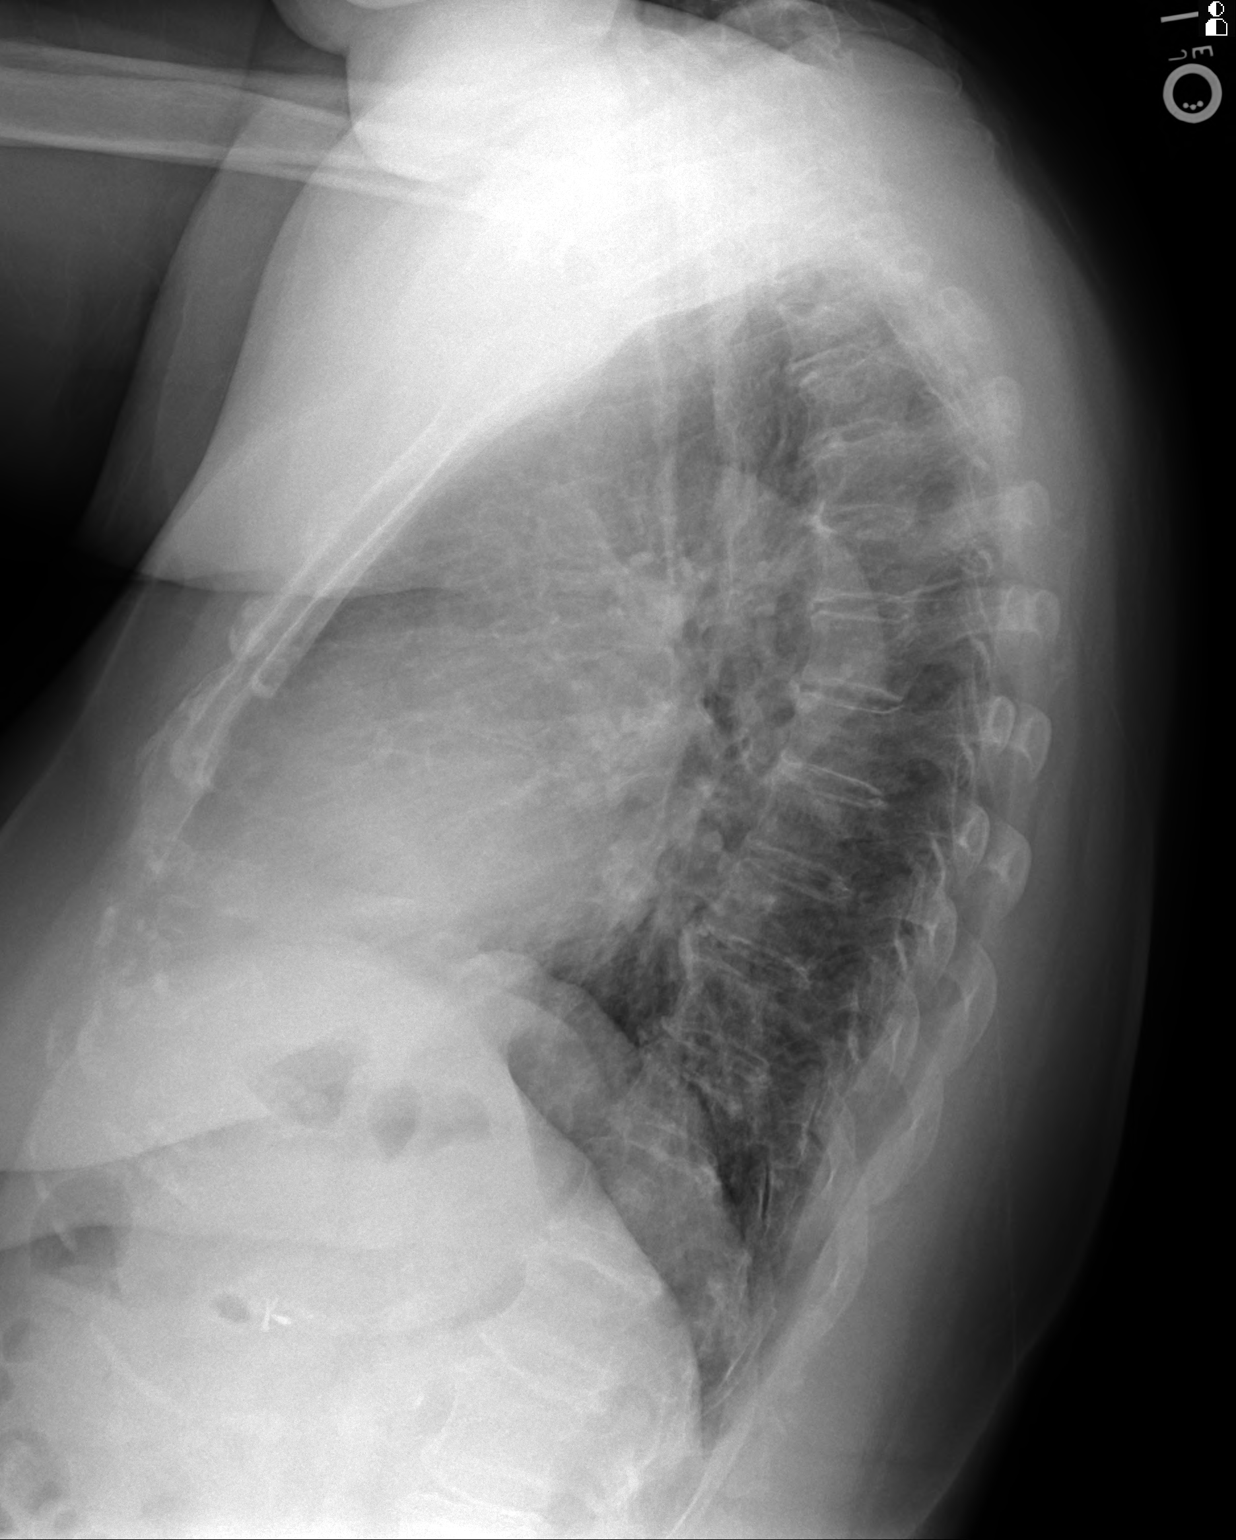
[im 2/2]
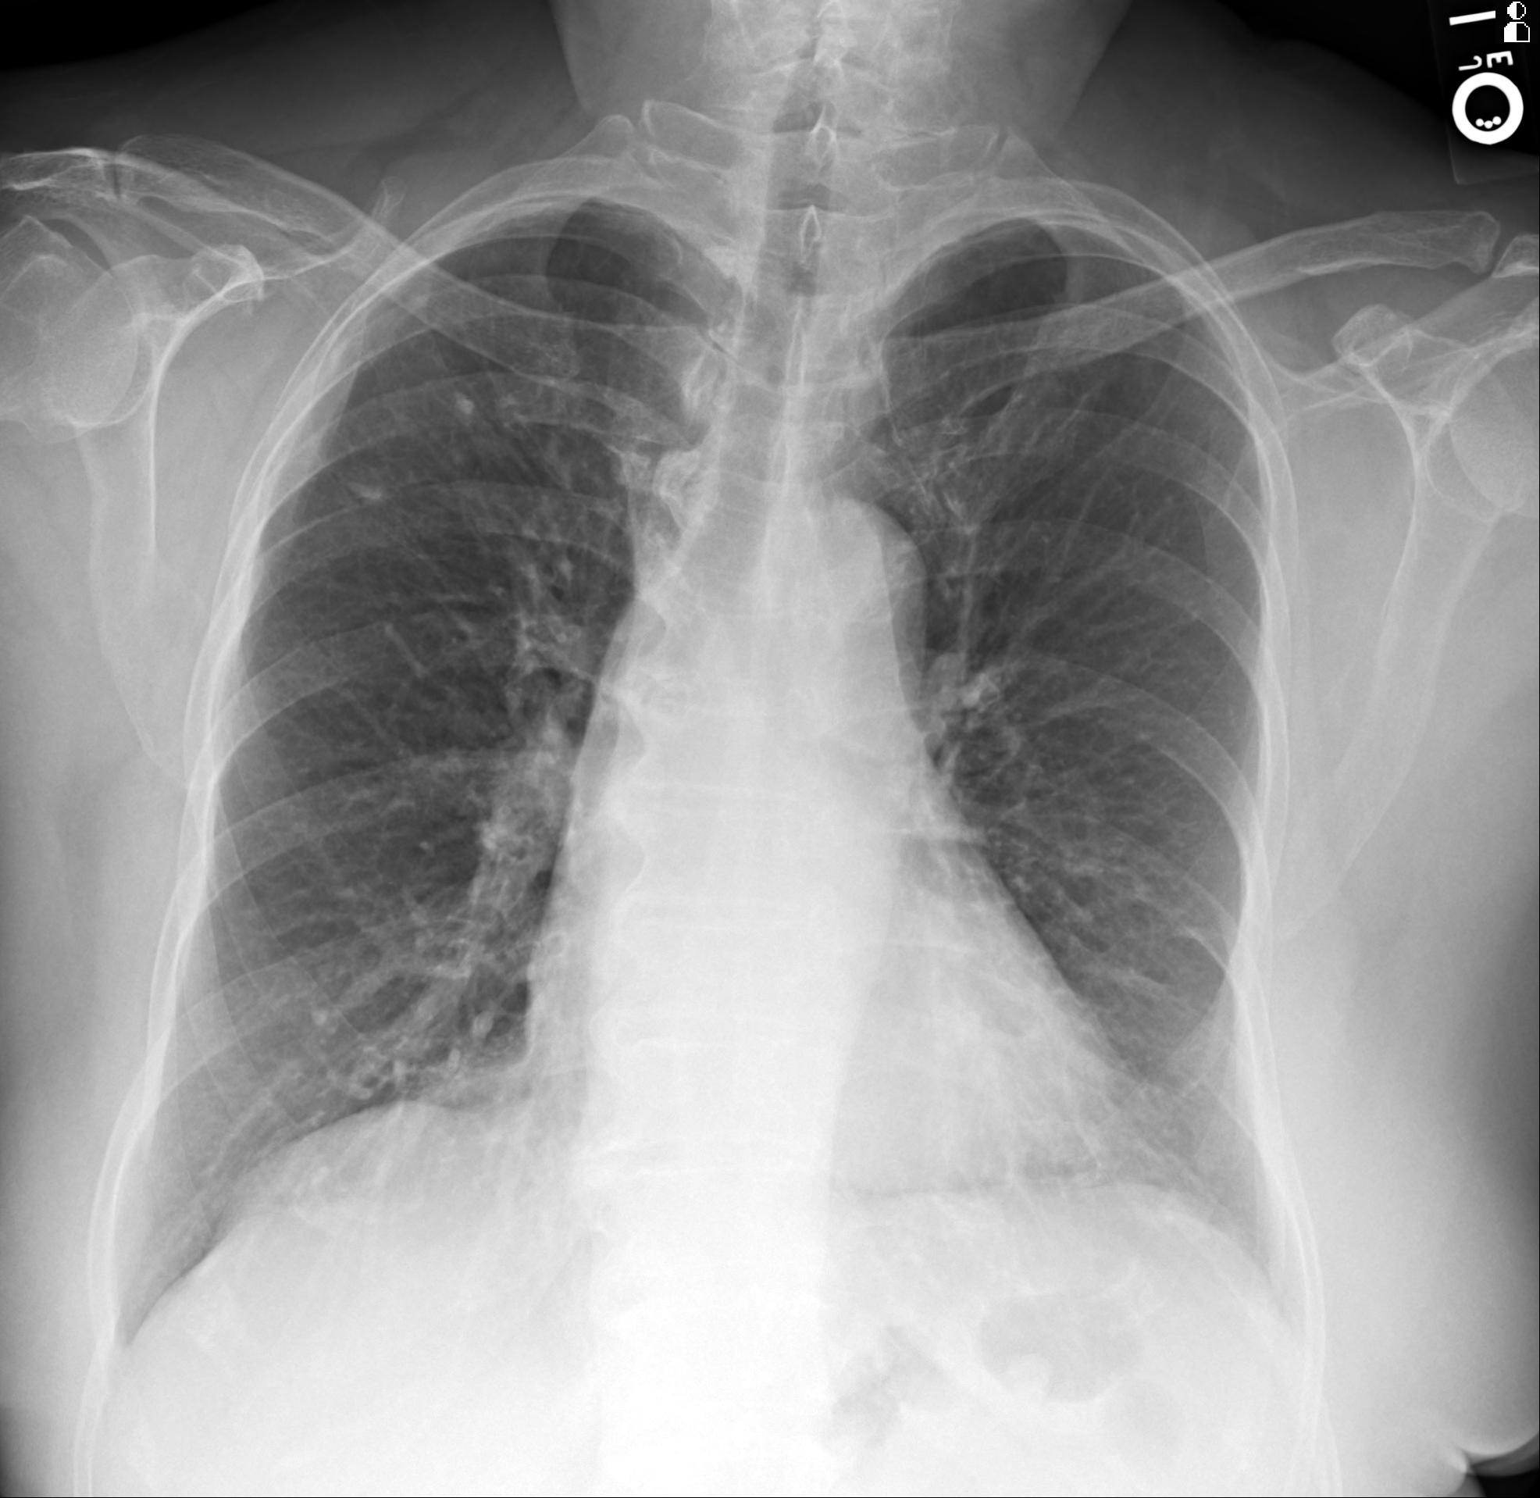

[2 of 2 positions shown; findings below may reference images not displayed]

FINDINGS: Heart size is normal. No pleural effusion or pneumothorax. No consolidation. Small calcifications in the right upper lung, likely chronic granulomas. Mild left basilar atelectasis.
IMPRESSION: No acute chest findings.

## 2021-05-17 IMAGING — CR FEMUR LT 2 VWS
1 series · 4 of 4 positions shown · non-contrast
Comparison: None

REASON FOR EXAM: Left leg pain

[Series 1: ap · 0.17mm/px · 4 of 4 slices shown]
[im 1/4]
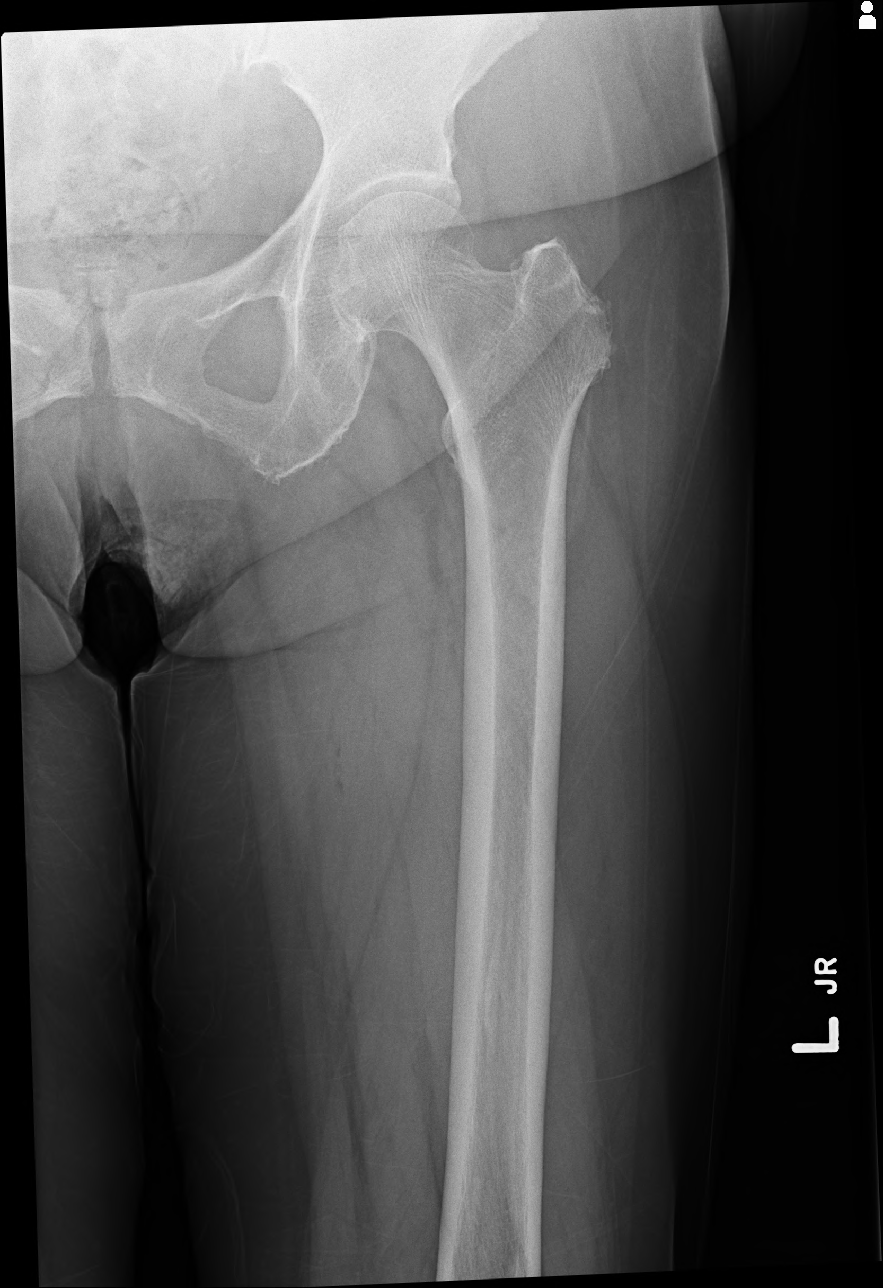
[im 2/4]
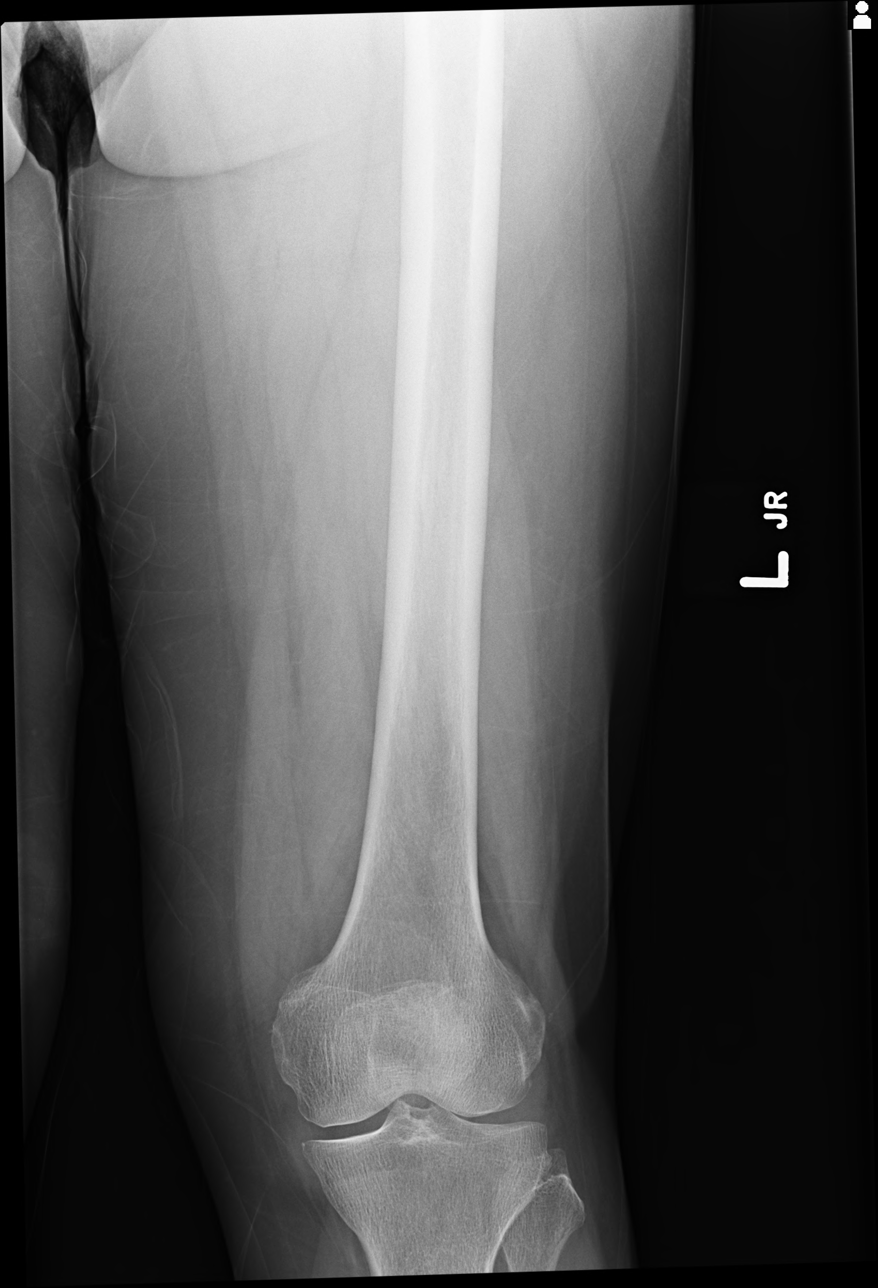
[im 3/4]
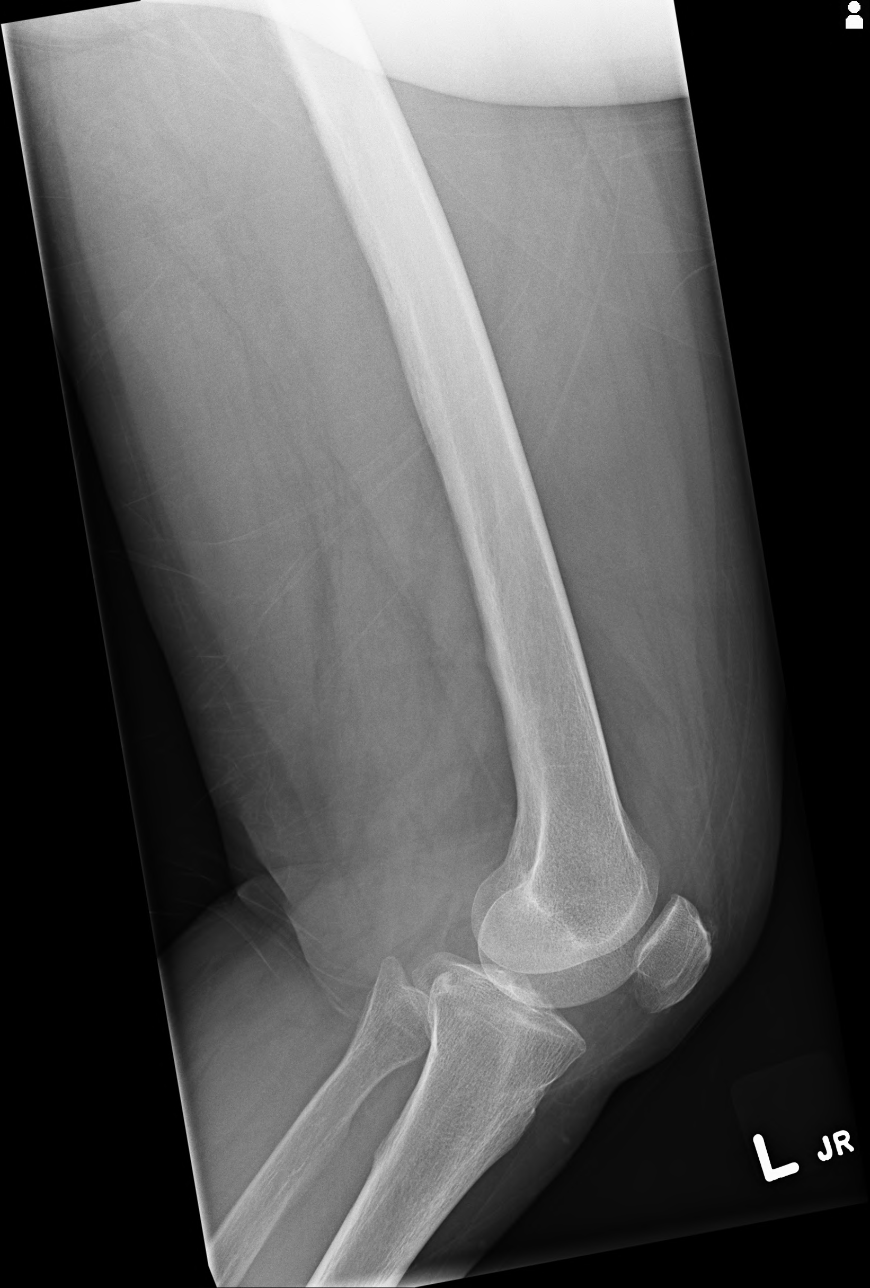
[im 4/4]
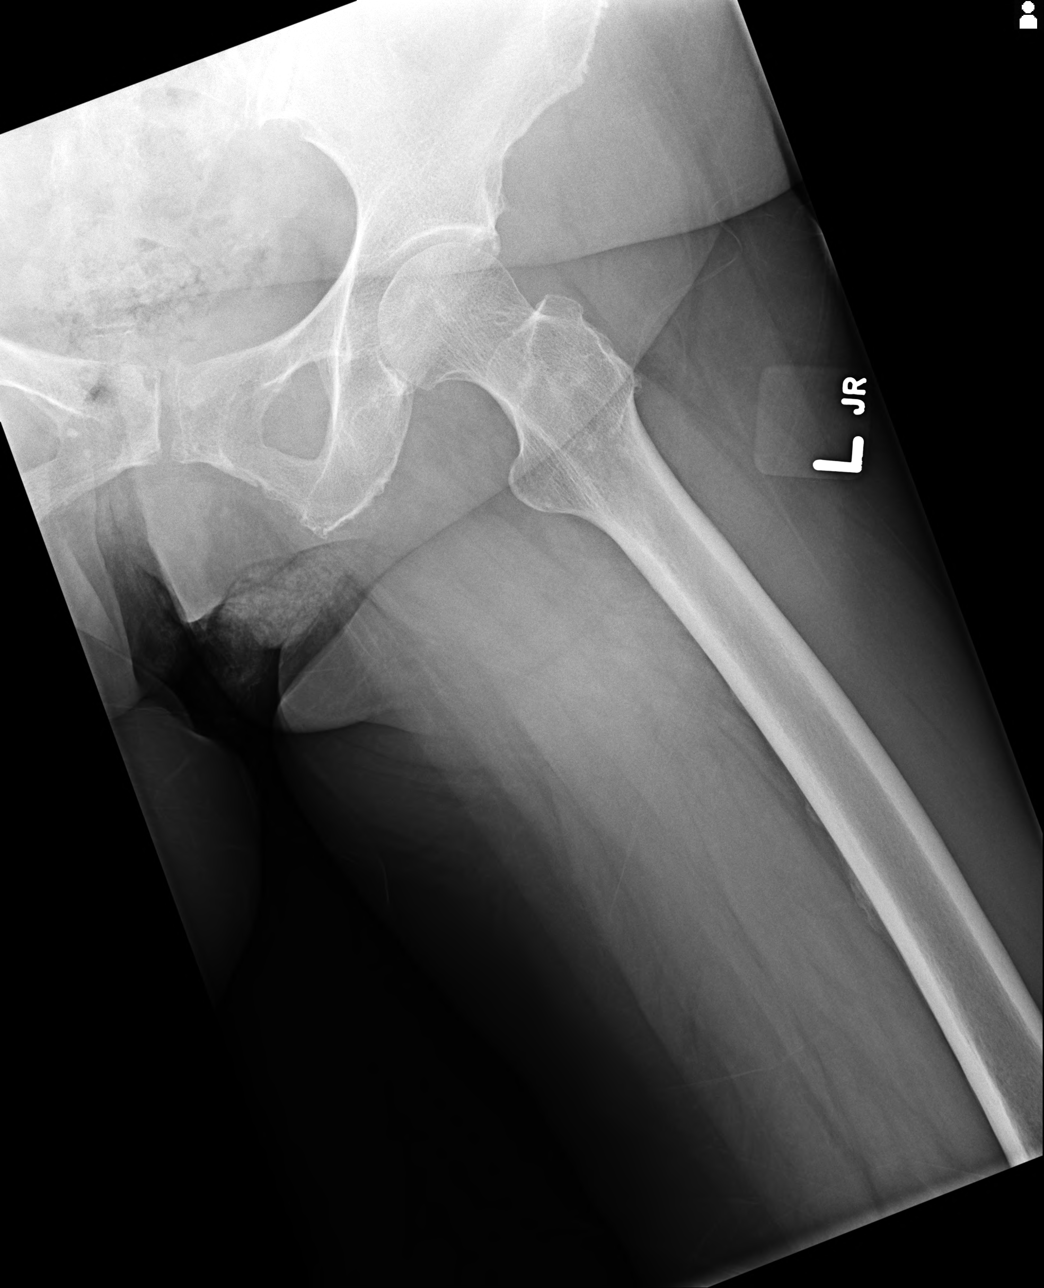

[4 of 4 positions shown; findings below may reference images not displayed]

FINDINGS: 2 views of the left femur show no significant bone or joint abnormality.  In particular, there is no evidence of fracture, dislocation or radiopaque foreign body. No effusion is identified.
IMPRESSION: Negative exam.

## 2021-05-17 IMAGING — CR L-SPINE 4 VWS MIN
1 series · 5 of 5 positions shown · non-contrast
Comparison: None.

REASON FOR EXAM: Pain.

[Series 1: AP · 0.17mm/px · 5 of 5 slices shown]
[im 1/5]
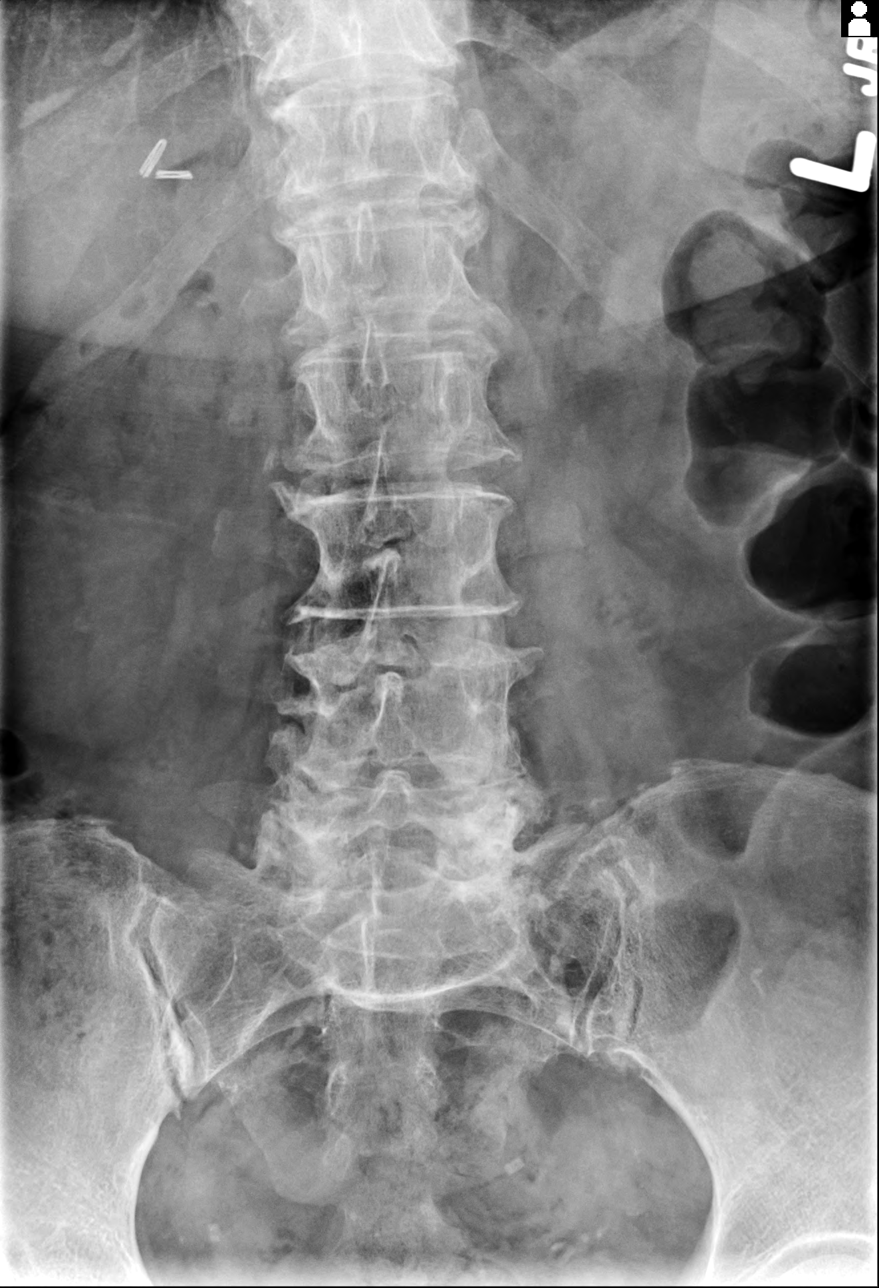
[im 2/5]
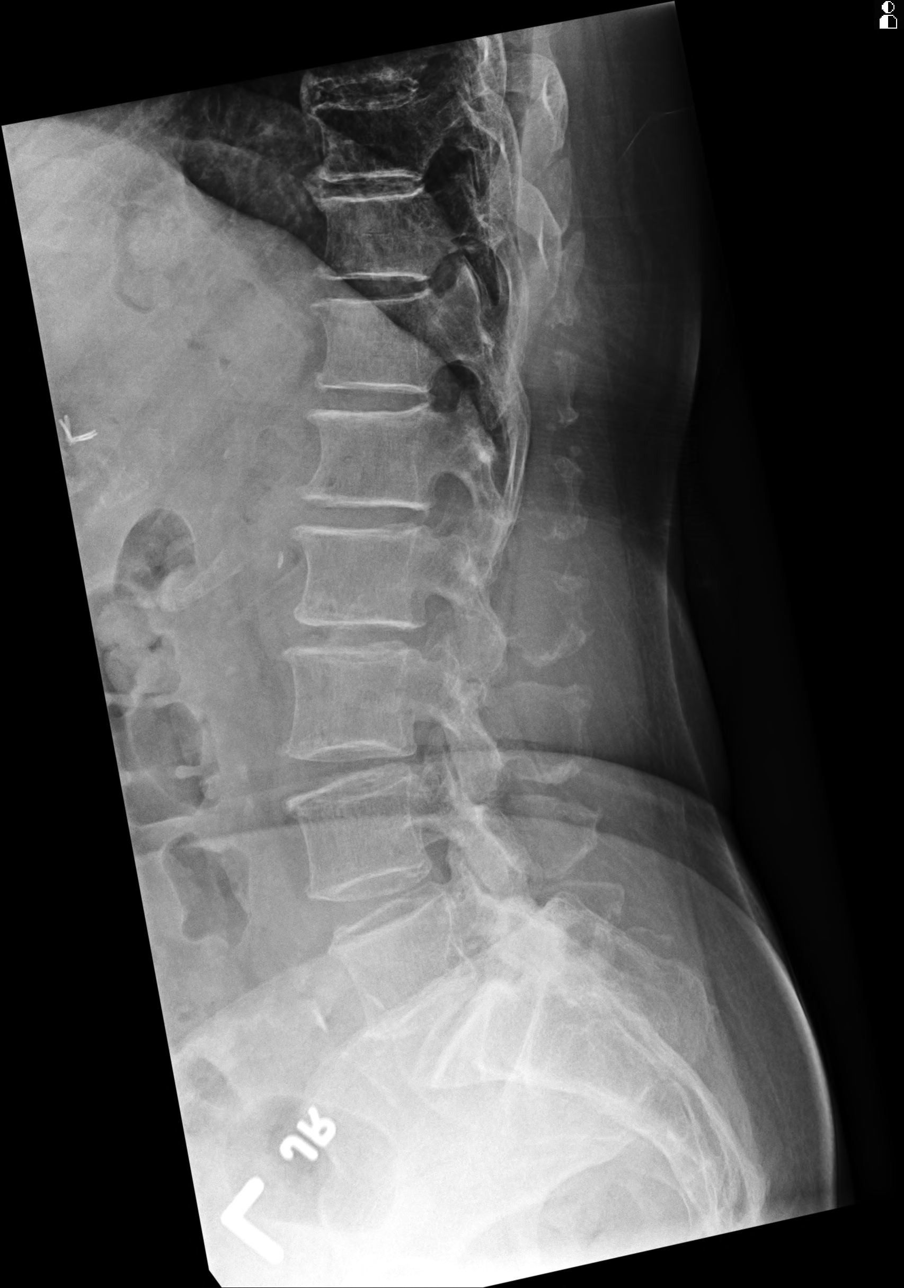
[im 3/5]
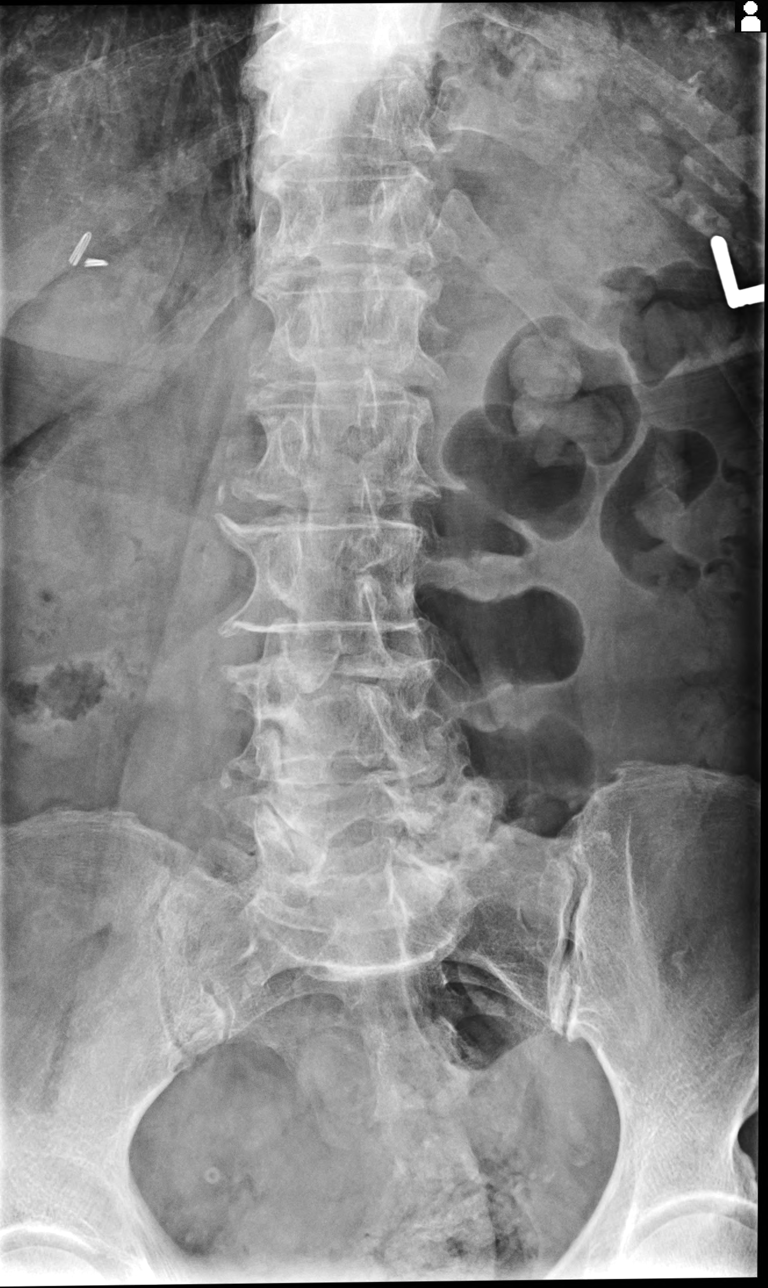
[im 4/5]
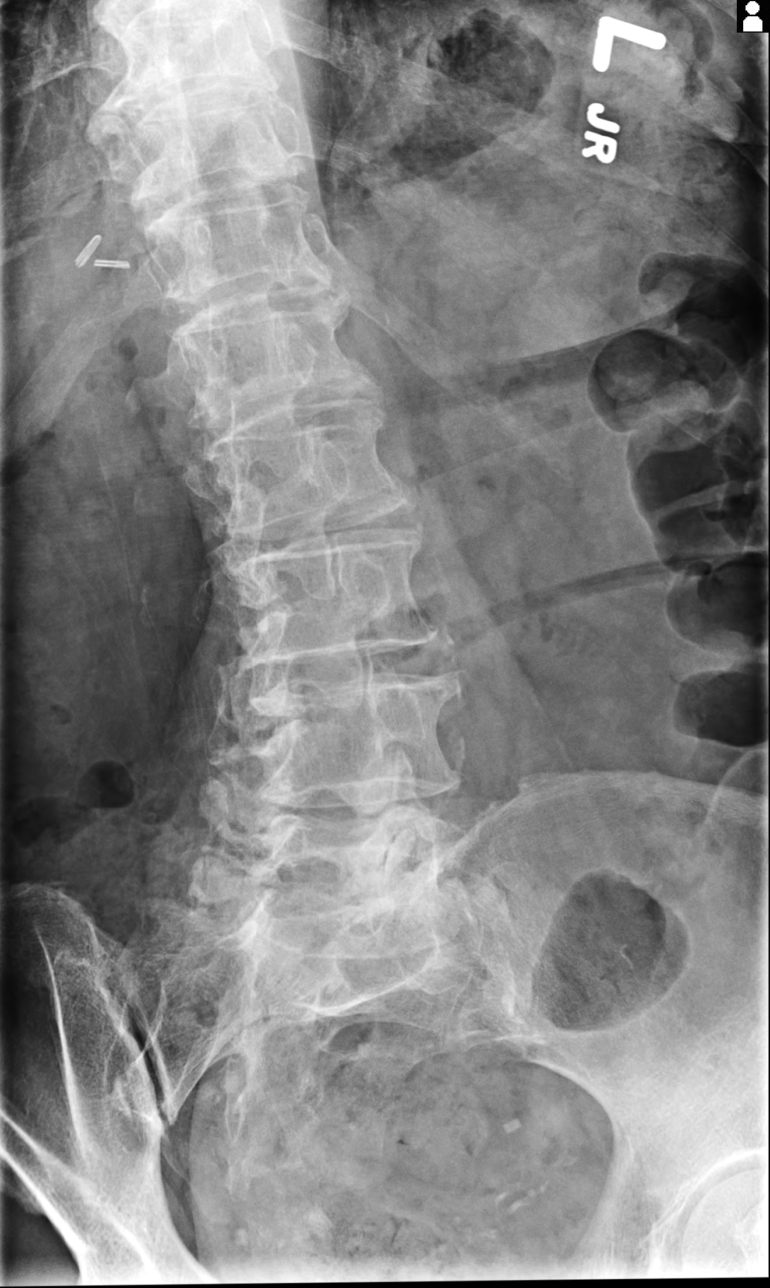
[im 5/5]
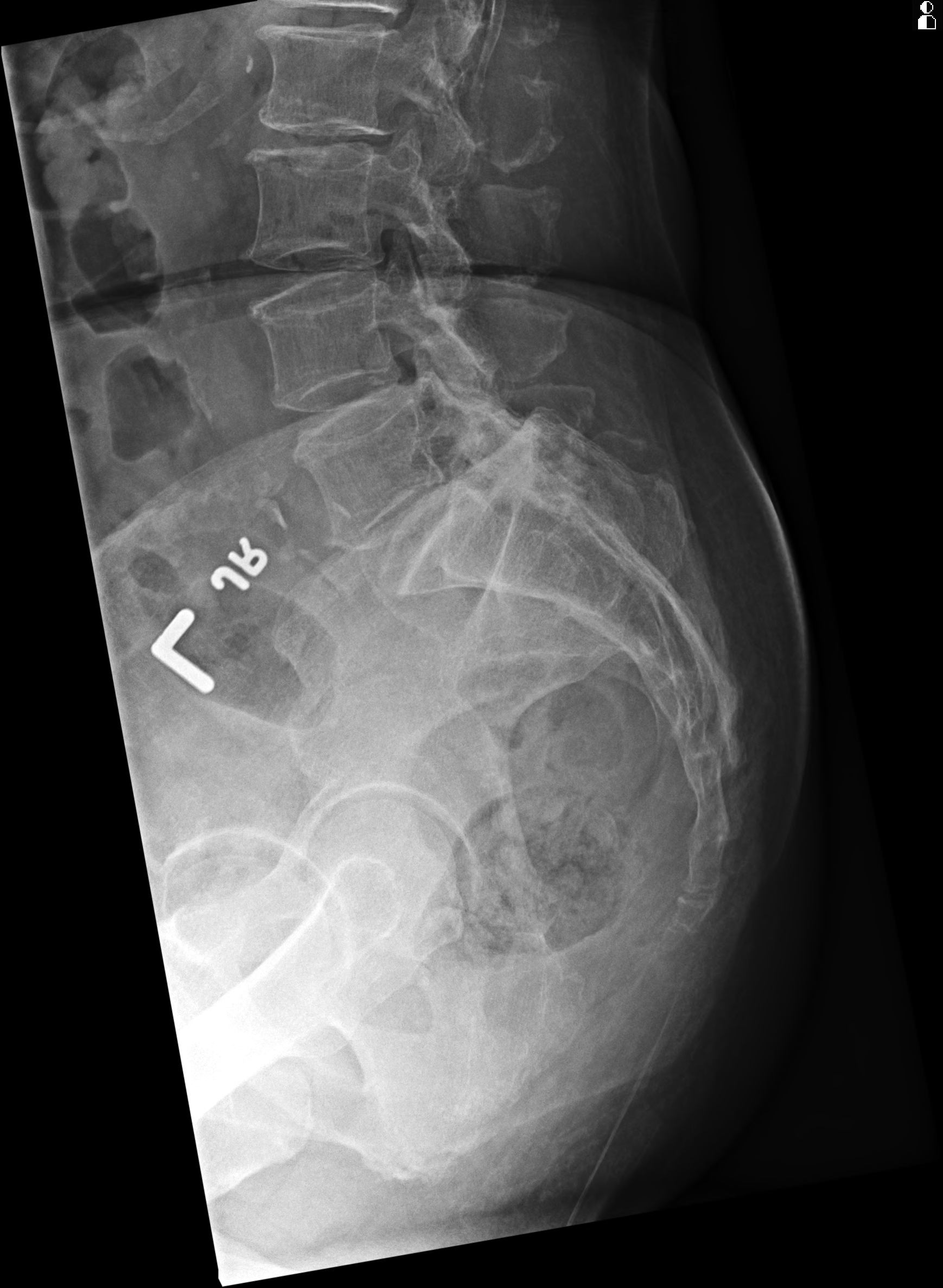

[5 of 5 positions shown; findings below may reference images not displayed]

TECHNIQUE AND FINDINGS: 5 views of lumbar spine show 5 lumbar-type vertebral bodies which have a grossly normal height and alignment. The bones are osteopenic. Hypertrophic changes are seen to the facets at L5-S1. No pars defects are seen. Surgical clips project over the right upper quadrant of the abdomen.
IMPRESSION: Facet arthropathy L5-S1.

## 2022-01-29 IMAGING — MG MAMMO SCRN BIL W/CAD TOMO
8 series · 8 of 24 positions shown · non-contrast
Comparison: The present examination has been compared to prior imaging studies.

Images Obtained from [facility]
INDICATION: Screening.
TECHNIQUE: Bilateral 2-D digital screening mammogram was performed followed by 3-D tomosynthesis.  Current study was also evaluated with a computer aided detection (CAD) system.
MAMMOGRAM FINDINGS:
There are scattered areas of fibroglandular density.
No suspicious abnormality is seen in either breast.  There are no significant changes from the prior study.

[L MLO]
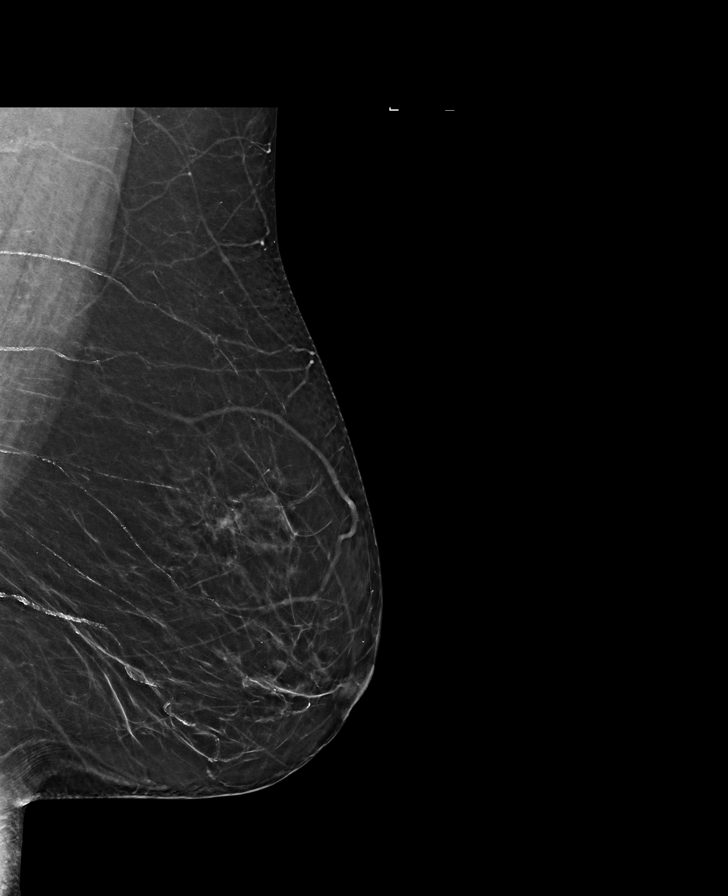

[R CC]
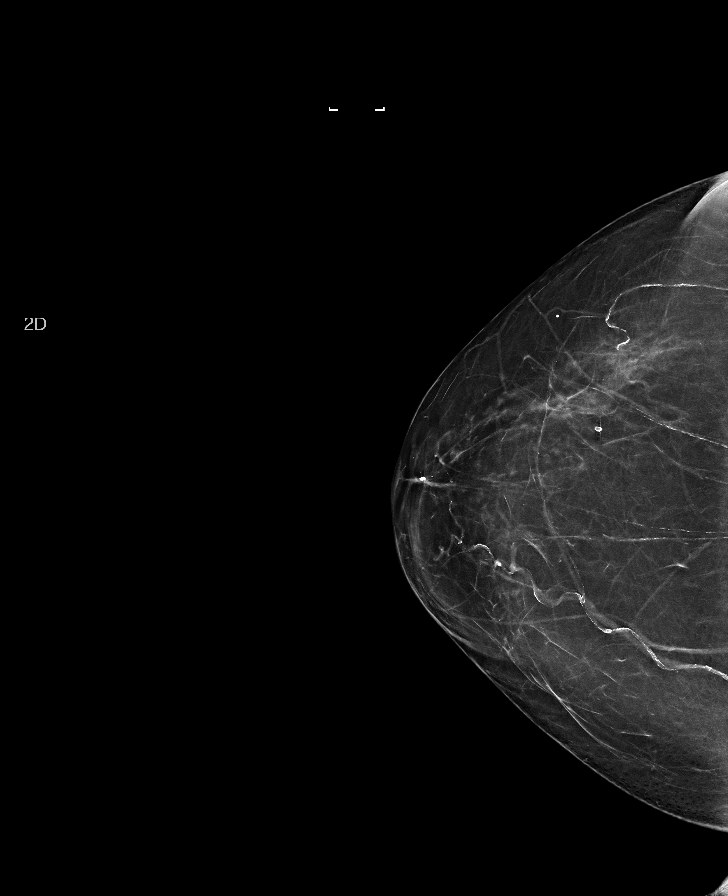

[R MLO]
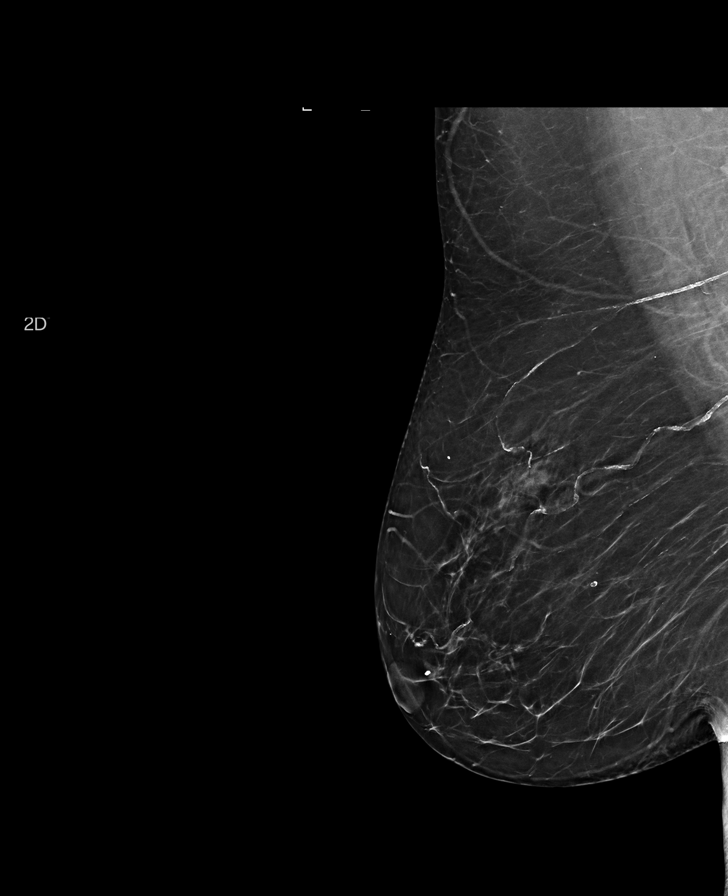

[L CC]
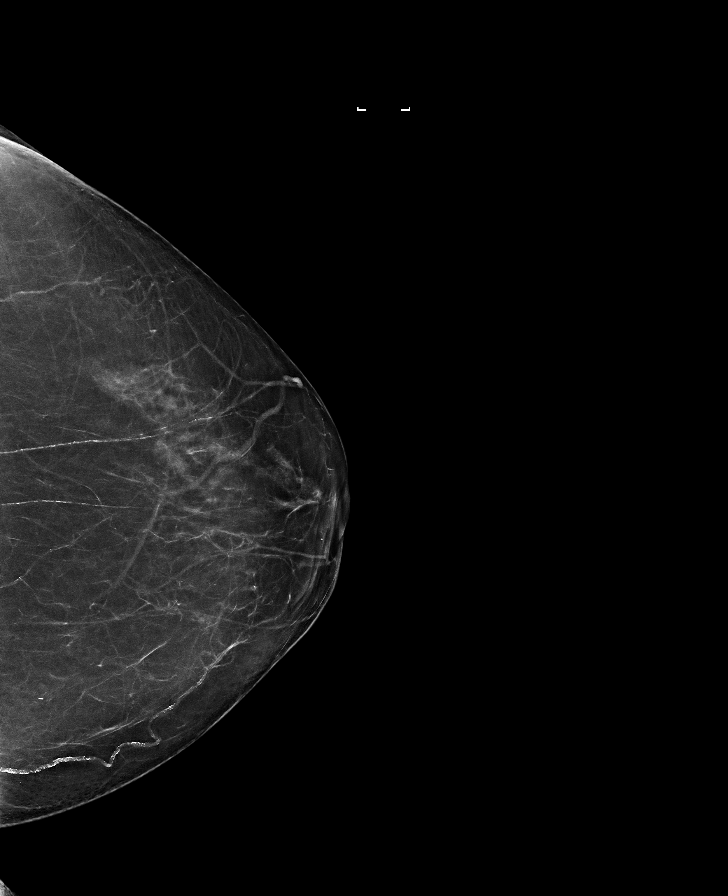

[L CC tomo · tomo slice 12/23.0]
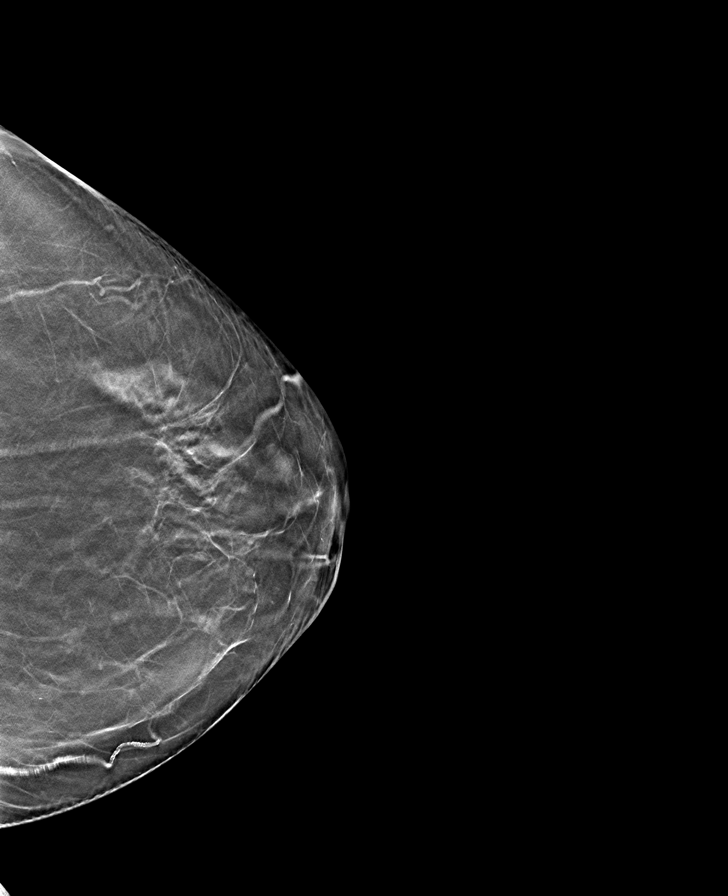

[L MLO tomo · tomo slice 12/23.0]
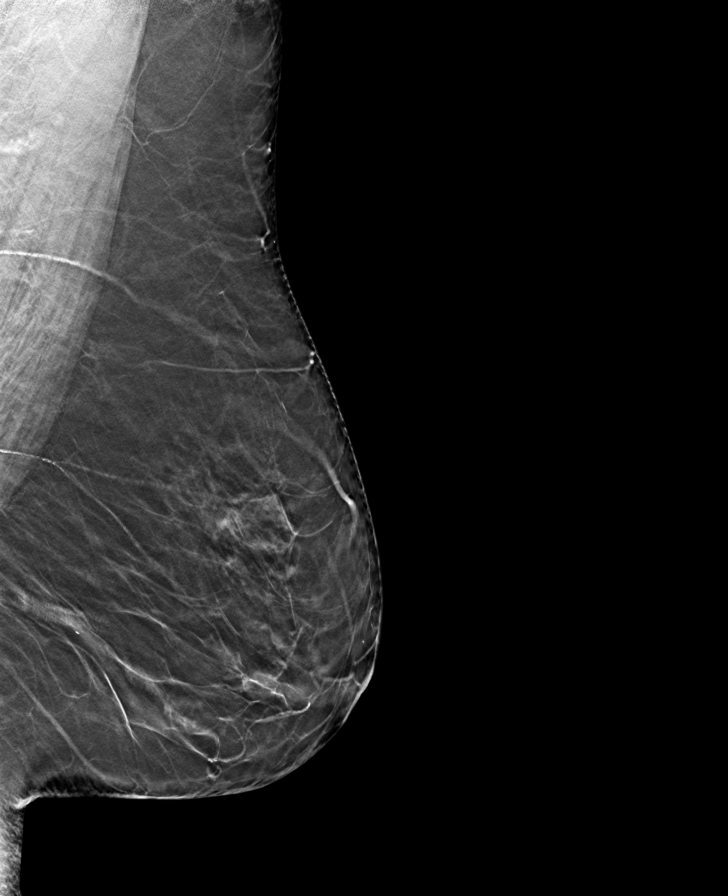

[R MLO tomo · tomo slice 13/25.0]
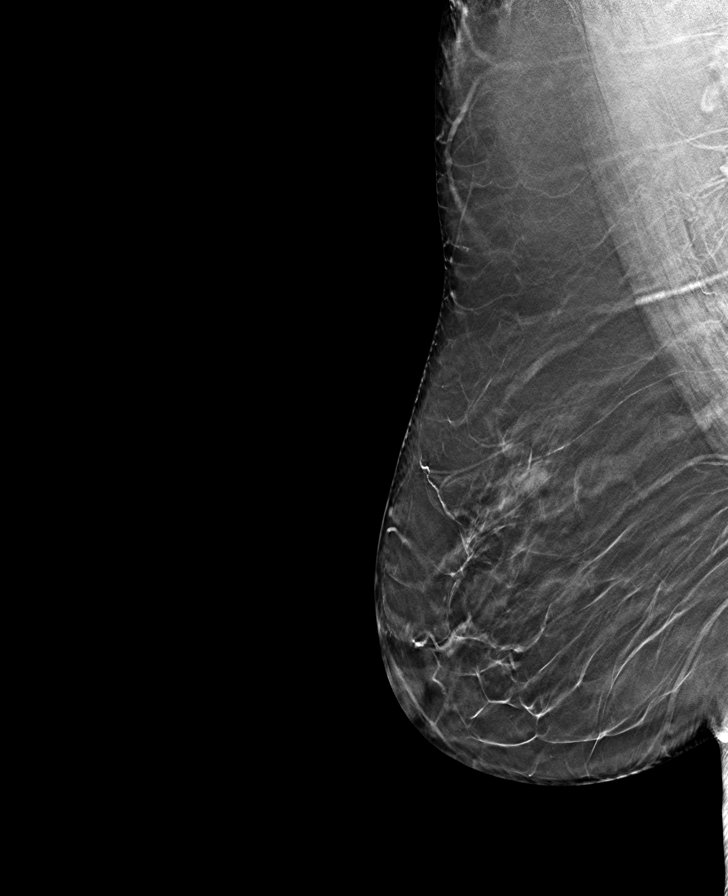

[R CC tomo · tomo slice 11/22.0]
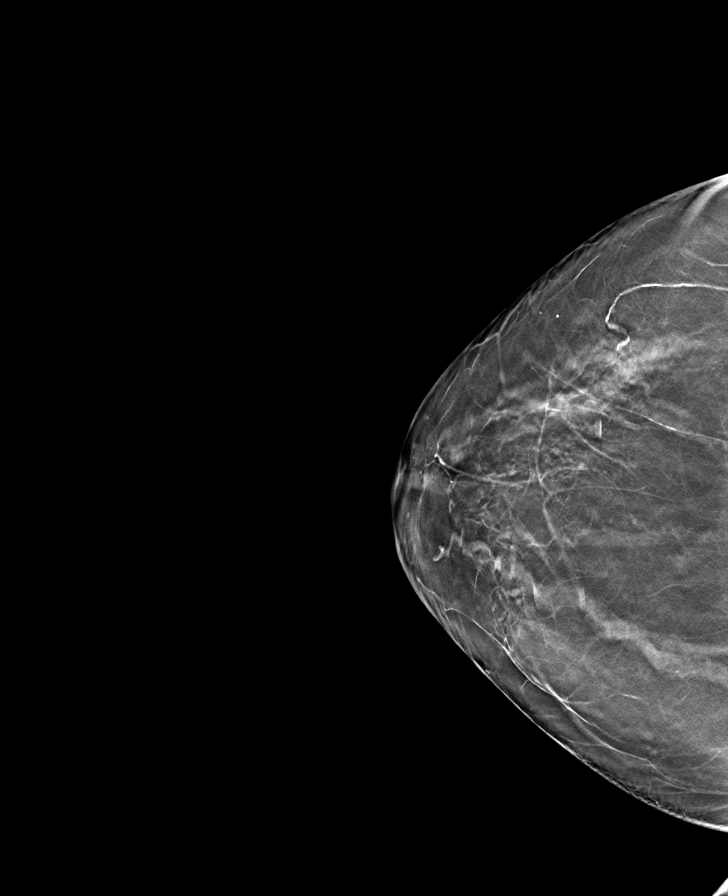

[8 of 24 positions shown; findings below may reference images not displayed]

IMPRESSION: There is no mammographic evidence of malignancy.
Screening mammogram recommended in 1 year.
BI-RADS Category 1: Negative

## 2022-06-17 IMAGING — MR MRI SHOULDER LT WO CONTRAST
4 of 5 series · 15 of 40 positions shown · non-contrast
Comparison: Left shoulder radiographs, 03/28/2022

Images Obtained from [facility]
INDICATION: Strain of muscle(s) and tendon(s) of the rotator cuff of left shoulder, initial encounter, Impingement syndrome of left shoulder.
TECHNIQUE: Multiplanar, multisequence MR imaging of the left shoulder without contrast.

[Series 5: t2_axial_fs · axial · left · 3.0mm · 0.26mm/px · z∈[-42,+19]mm · 6 of 20 slices shown]
[im 1/20]
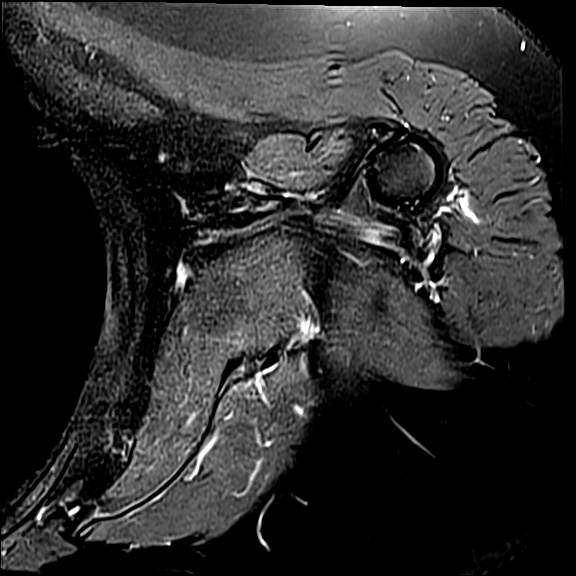
[im 3/20]
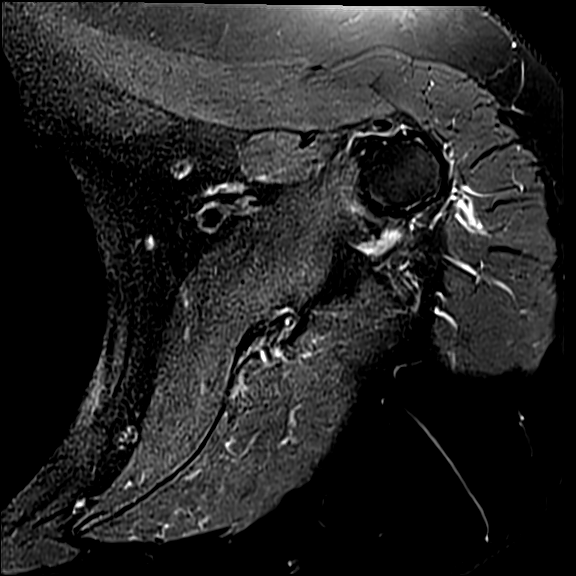
[im 6/20]
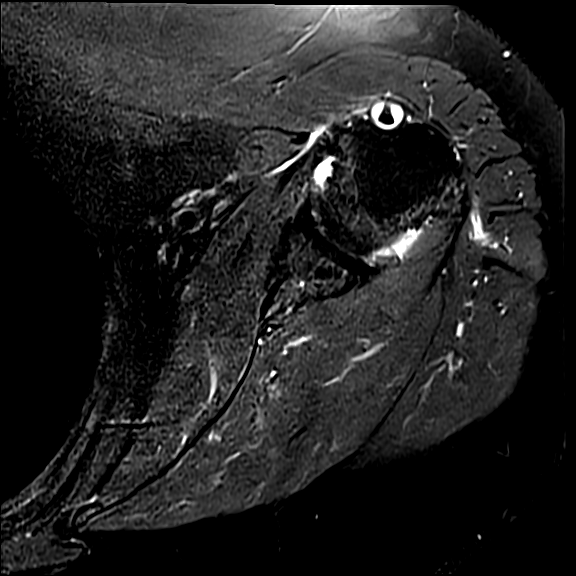
[im 9/20]
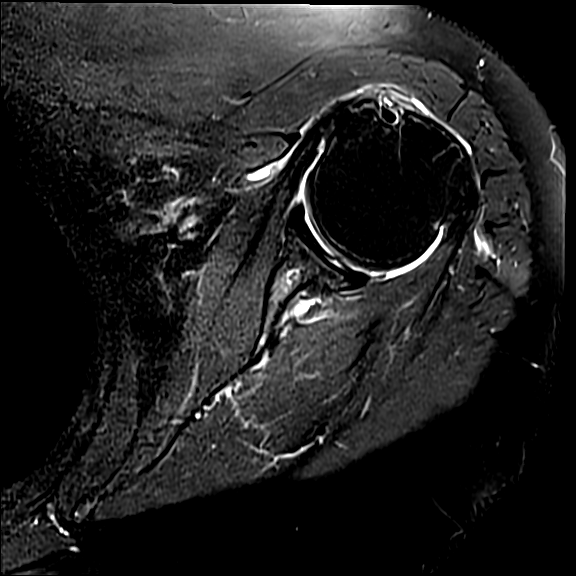
[im 11/20]
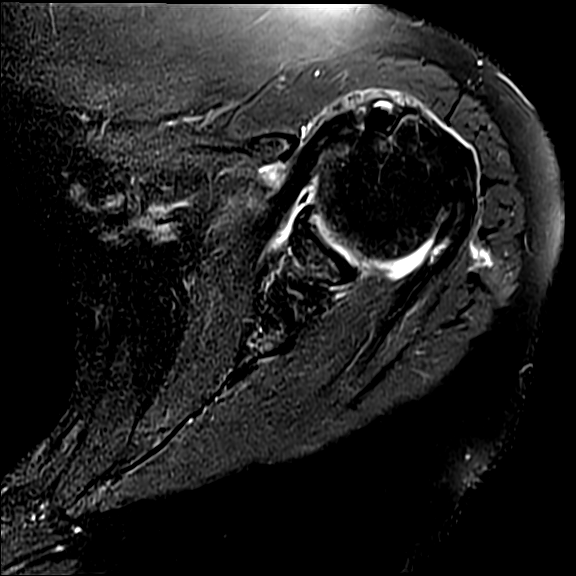
[im 17/20]
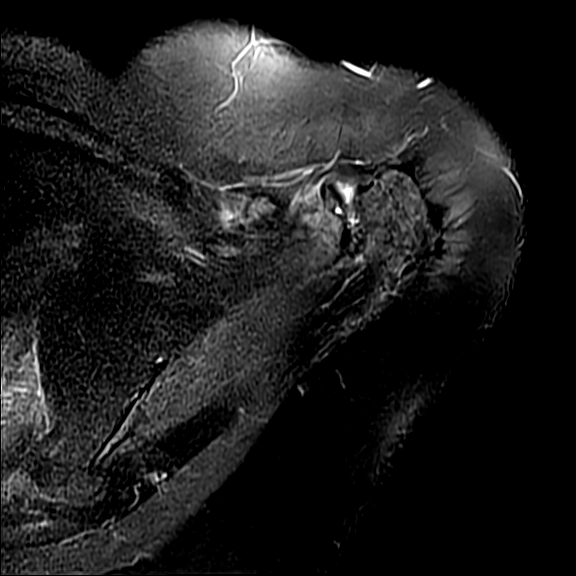

[Series 6: t2_cor_obl_fs · oblique · left · 3.0mm · 0.25mm/px · 3 of 18 slices shown]
[im 4/18]
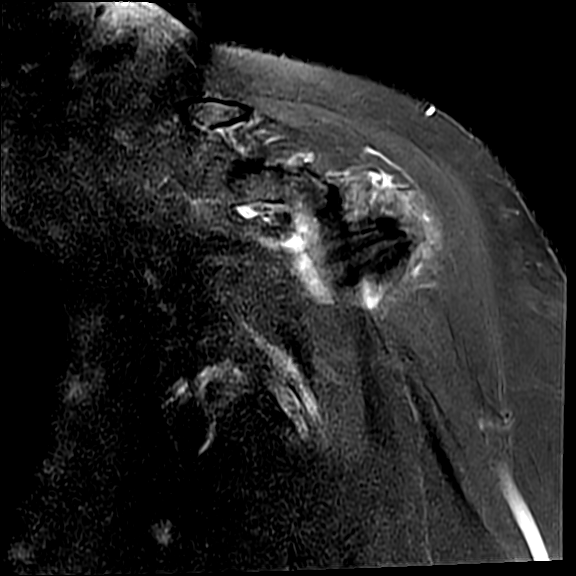
[im 11/18]
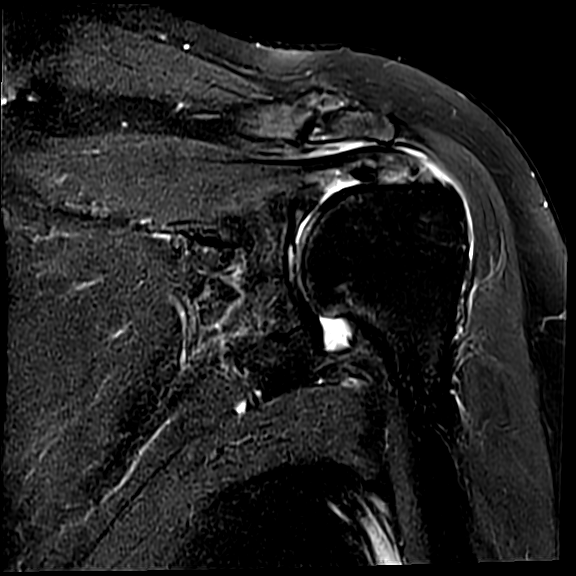
[im 18/18]
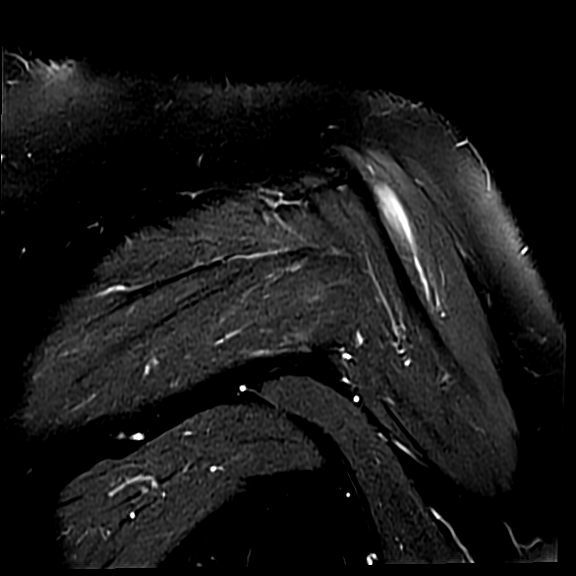

[Series 7: t2_sag_obl_fs · oblique · left · 3.0mm · 0.28mm/px · 3 of 28 slices shown]
[im 4/28]
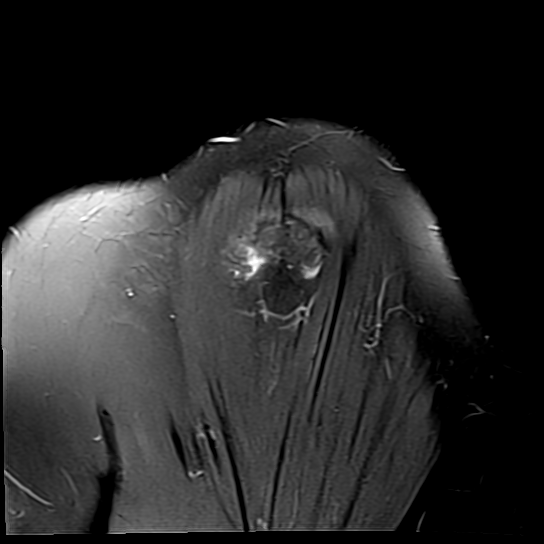
[im 16/28]
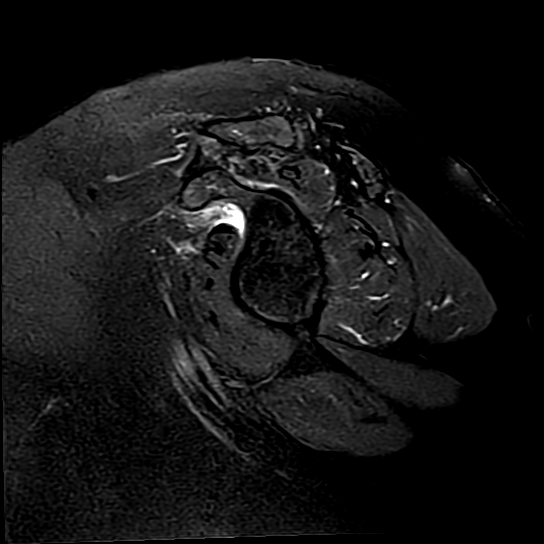
[im 25/28]
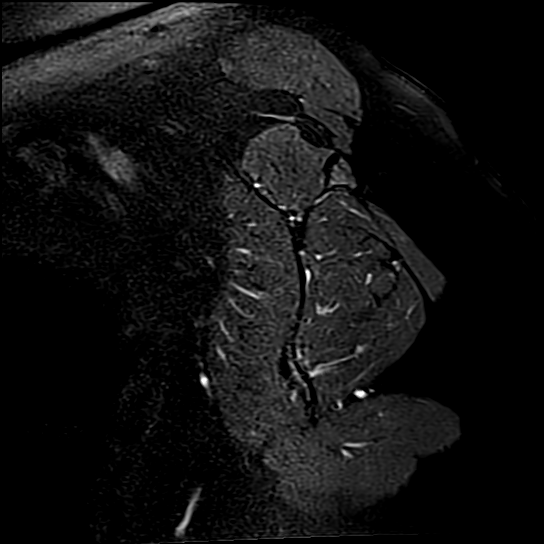

[Series 8: t1_sag_obl · oblique · left · 3.0mm · 0.26mm/px · 3 of 28 slices shown]
[im 4/28]
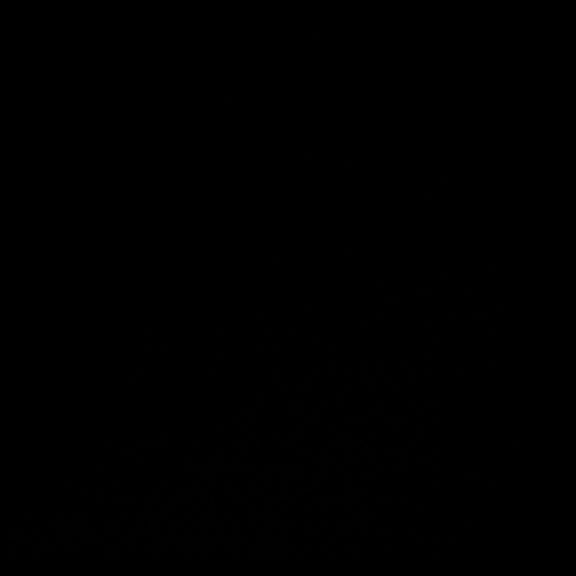
[im 16/28]
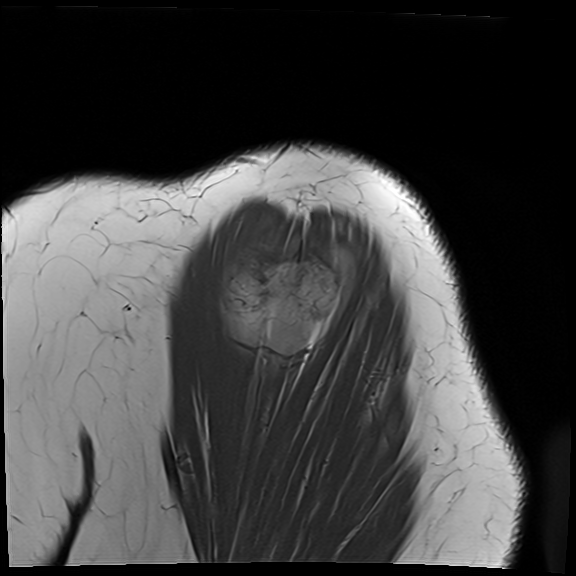
[im 25/28]
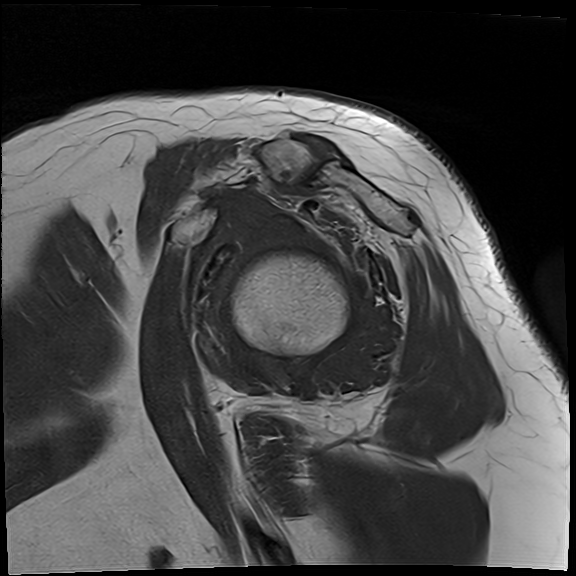

[15 of 40 positions shown; findings below may reference images not displayed]

FINDINGS: OSSEOUS: No acute fracture, avascular necrosis or aggressive osseous lesion.
ACROMIAL OUTLET:
Acromioclavicular joint osteoarthritis: Mild.
Acromion: Non-hooked.
Subacromial/subdeltoid bursa fluid: Small fluid.
Coracoclavicular and coracoacromial ligaments: Intact.
ROTATOR CUFF TENDONS:
Supraspinatus and infraspinatus: 7 mm high-grade partial-thickness articular surface tear of the supraspinatus tendon footprint, with 4 mm of medial tendon retraction from the greater tuberosity.
Moderate background supraspinatus tendinosis. Mild to moderate infraspinatus tendinosis. 2 mm low-grade partial-thickness articular surface tear of the confluent supraspinatus/infraspinatus tendon
fibers located 14 mm medial to the tendon footprint.
Teres minor: Intact.
Subscapularis: Mild tendinosis.
ROTATOR CUFF MUSCLES: No fatty atrophy or intramuscular edema.
LABRUM: Degeneration of the superior labrum.
BICEPS TENDON: Moderate proximal and articular long head biceps tendinosis. Proximal extra-articular long head biceps tendon is intact
GLENOHUMERAL JOINT: Mild chondral thinning and irregularity of the inferior glenohumeral articular cartilage. Small deep partial-thickness chondral thinning of the glenoid. No underlying reactive
osseous change. Small glenohumeral joint effusion.
OTHER: The inferior glenohumeral ligament is unremarkable. Mild edema within the lateral head of the deltoid muscle.
IMPRESSION: 1.  Moderate-sized high-grade partial-thickness articular surface tear of the supraspinatus tendon footprint. Moderate background supraspinatus tendinosis. No supraspinatus muscle atrophy.
2.  Mild to moderate infraspinatus tendinosis with a small low-grade partial-thickness articular surface tear of the confluent supraspinatus/infraspinatus tendon fibers, medial to the footprint.
3.  Moderate proximal intra-articular long head biceps tendinosis.
4.  Mild to moderate glenohumeral joint chondrosis.
5.  Mild acromioclavicular joint osteoarthritis.
